# Patient Record
Sex: Female | Born: 1970
Health system: Southern US, Community
[De-identification: ages and names within clinical notes are randomized; demographics above are authoritative.]

## PROBLEM LIST (undated history)

## (undated) DIAGNOSIS — I472 Ventricular tachycardia, unspecified: Secondary | ICD-10-CM

## (undated) DIAGNOSIS — J309 Allergic rhinitis, unspecified: Secondary | ICD-10-CM

## (undated) DIAGNOSIS — R002 Palpitations: Secondary | ICD-10-CM

## (undated) DIAGNOSIS — J45909 Unspecified asthma, uncomplicated: Secondary | ICD-10-CM

## (undated) DIAGNOSIS — R079 Chest pain, unspecified: Secondary | ICD-10-CM

## (undated) DIAGNOSIS — F419 Anxiety disorder, unspecified: Secondary | ICD-10-CM

## (undated) DIAGNOSIS — F988 Other specified behavioral and emotional disorders with onset usually occurring in childhood and adolescence: Secondary | ICD-10-CM

## (undated) DIAGNOSIS — G43909 Migraine, unspecified, not intractable, without status migrainosus: Secondary | ICD-10-CM

## (undated) DIAGNOSIS — I10 Essential (primary) hypertension: Secondary | ICD-10-CM

## (undated) DIAGNOSIS — K279 Peptic ulcer, site unspecified, unspecified as acute or chronic, without hemorrhage or perforation: Secondary | ICD-10-CM

## (undated) HISTORY — DX: Peptic ulcer, site unspecified, unspecified as acute or chronic, without hemorrhage or perforation: K27.9

## (undated) HISTORY — DX: Ventricular tachycardia, unspecified: I47.20

## (undated) HISTORY — DX: Ventricular tachycardia: I47.2

## (undated) HISTORY — DX: Unspecified asthma, uncomplicated: J45.909

## (undated) HISTORY — PX: TUBAL LIGATION: SHX77

## (undated) HISTORY — DX: Other specified behavioral and emotional disorders with onset usually occurring in childhood and adolescence: F98.8

## (undated) HISTORY — DX: Migraine, unspecified, not intractable, without status migrainosus: G43.909

## (undated) HISTORY — DX: Palpitations: R00.2

## (undated) HISTORY — DX: Chest pain, unspecified: R07.9

## (undated) HISTORY — DX: Allergic rhinitis, unspecified: J30.9

## (undated) HISTORY — DX: Anxiety disorder, unspecified: F41.9

---

## 1997-11-12 ENCOUNTER — Ambulatory Visit (HOSPITAL_COMMUNITY): Admission: RE | Admit: 1997-11-12 | Discharge: 1997-11-12 | Payer: Self-pay | Admitting: Obstetrics and Gynecology

## 1998-03-30 ENCOUNTER — Inpatient Hospital Stay (HOSPITAL_COMMUNITY): Admission: AD | Admit: 1998-03-30 | Discharge: 1998-04-02 | Payer: Self-pay | Admitting: Obstetrics and Gynecology

## 1999-05-08 ENCOUNTER — Other Ambulatory Visit: Admission: RE | Admit: 1999-05-08 | Discharge: 1999-05-08 | Payer: Self-pay | Admitting: Obstetrics and Gynecology

## 1999-05-29 ENCOUNTER — Observation Stay (HOSPITAL_COMMUNITY): Admission: RE | Admit: 1999-05-29 | Discharge: 1999-05-30 | Payer: Self-pay | Admitting: Oral & Maxillofacial Surgery

## 2000-02-13 ENCOUNTER — Ambulatory Visit (HOSPITAL_COMMUNITY): Admission: RE | Admit: 2000-02-13 | Discharge: 2000-02-13 | Payer: Self-pay | Admitting: *Deleted

## 2000-02-13 ENCOUNTER — Encounter (INDEPENDENT_AMBULATORY_CARE_PROVIDER_SITE_OTHER): Payer: Self-pay | Admitting: Specialist

## 2000-05-07 ENCOUNTER — Other Ambulatory Visit: Admission: RE | Admit: 2000-05-07 | Discharge: 2000-05-07 | Payer: Self-pay | Admitting: Gynecology

## 2000-06-14 ENCOUNTER — Other Ambulatory Visit: Admission: RE | Admit: 2000-06-14 | Discharge: 2000-06-14 | Payer: Self-pay | Admitting: Gynecology

## 2000-06-14 ENCOUNTER — Encounter (INDEPENDENT_AMBULATORY_CARE_PROVIDER_SITE_OTHER): Payer: Self-pay

## 2000-10-25 ENCOUNTER — Other Ambulatory Visit: Admission: RE | Admit: 2000-10-25 | Discharge: 2000-10-25 | Payer: Self-pay | Admitting: Obstetrics and Gynecology

## 2000-10-28 ENCOUNTER — Ambulatory Visit (HOSPITAL_COMMUNITY): Admission: RE | Admit: 2000-10-28 | Discharge: 2000-10-28 | Payer: Self-pay | Admitting: Obstetrics and Gynecology

## 2000-10-28 ENCOUNTER — Encounter (INDEPENDENT_AMBULATORY_CARE_PROVIDER_SITE_OTHER): Payer: Self-pay | Admitting: Specialist

## 2000-12-27 ENCOUNTER — Encounter: Payer: Self-pay | Admitting: Obstetrics and Gynecology

## 2000-12-27 ENCOUNTER — Ambulatory Visit (HOSPITAL_COMMUNITY): Admission: RE | Admit: 2000-12-27 | Discharge: 2000-12-27 | Payer: Self-pay | Admitting: Obstetrics and Gynecology

## 2001-02-06 ENCOUNTER — Other Ambulatory Visit: Admission: RE | Admit: 2001-02-06 | Discharge: 2001-02-06 | Payer: Self-pay | Admitting: Obstetrics and Gynecology

## 2001-09-29 ENCOUNTER — Other Ambulatory Visit: Admission: RE | Admit: 2001-09-29 | Discharge: 2001-09-29 | Payer: Self-pay | Admitting: Obstetrics and Gynecology

## 2002-02-22 ENCOUNTER — Inpatient Hospital Stay (HOSPITAL_COMMUNITY): Admission: AD | Admit: 2002-02-22 | Discharge: 2002-02-22 | Payer: Self-pay | Admitting: Obstetrics and Gynecology

## 2002-02-25 ENCOUNTER — Inpatient Hospital Stay (HOSPITAL_COMMUNITY): Admission: AD | Admit: 2002-02-25 | Discharge: 2002-02-25 | Payer: Self-pay | Admitting: Obstetrics and Gynecology

## 2002-02-27 ENCOUNTER — Inpatient Hospital Stay (HOSPITAL_COMMUNITY): Admission: AD | Admit: 2002-02-27 | Discharge: 2002-03-01 | Payer: Self-pay | Admitting: Obstetrics and Gynecology

## 2002-03-16 ENCOUNTER — Inpatient Hospital Stay (HOSPITAL_COMMUNITY): Admission: AD | Admit: 2002-03-16 | Discharge: 2002-03-16 | Payer: Self-pay | Admitting: Obstetrics and Gynecology

## 2002-03-27 ENCOUNTER — Inpatient Hospital Stay (HOSPITAL_COMMUNITY): Admission: AD | Admit: 2002-03-27 | Discharge: 2002-03-27 | Payer: Self-pay | Admitting: Obstetrics and Gynecology

## 2002-03-31 ENCOUNTER — Inpatient Hospital Stay (HOSPITAL_COMMUNITY): Admission: AD | Admit: 2002-03-31 | Discharge: 2002-03-31 | Payer: Self-pay | Admitting: Obstetrics and Gynecology

## 2002-04-06 ENCOUNTER — Encounter (INDEPENDENT_AMBULATORY_CARE_PROVIDER_SITE_OTHER): Payer: Self-pay

## 2002-04-06 ENCOUNTER — Inpatient Hospital Stay (HOSPITAL_COMMUNITY): Admission: AD | Admit: 2002-04-06 | Discharge: 2002-04-09 | Payer: Self-pay | Admitting: Obstetrics and Gynecology

## 2002-05-19 ENCOUNTER — Other Ambulatory Visit: Admission: RE | Admit: 2002-05-19 | Discharge: 2002-05-19 | Payer: Self-pay | Admitting: Obstetrics and Gynecology

## 2003-06-11 ENCOUNTER — Ambulatory Visit (HOSPITAL_COMMUNITY): Admission: RE | Admit: 2003-06-11 | Discharge: 2003-06-11 | Payer: Self-pay | Admitting: Obstetrics and Gynecology

## 2003-07-30 ENCOUNTER — Other Ambulatory Visit: Admission: RE | Admit: 2003-07-30 | Discharge: 2003-07-30 | Payer: Self-pay | Admitting: Obstetrics and Gynecology

## 2004-08-14 ENCOUNTER — Other Ambulatory Visit: Admission: RE | Admit: 2004-08-14 | Discharge: 2004-08-14 | Payer: Self-pay | Admitting: Obstetrics and Gynecology

## 2004-10-12 ENCOUNTER — Encounter (INDEPENDENT_AMBULATORY_CARE_PROVIDER_SITE_OTHER): Payer: Self-pay | Admitting: Specialist

## 2004-10-12 ENCOUNTER — Ambulatory Visit (HOSPITAL_COMMUNITY): Admission: RE | Admit: 2004-10-12 | Discharge: 2004-10-12 | Payer: Self-pay | Admitting: Obstetrics and Gynecology

## 2005-08-02 ENCOUNTER — Encounter: Admission: RE | Admit: 2005-08-02 | Discharge: 2005-08-02 | Payer: Self-pay | Admitting: Obstetrics and Gynecology

## 2005-09-20 ENCOUNTER — Other Ambulatory Visit: Admission: RE | Admit: 2005-09-20 | Discharge: 2005-09-20 | Payer: Self-pay | Admitting: Obstetrics and Gynecology

## 2006-10-16 ENCOUNTER — Ambulatory Visit: Payer: Self-pay | Admitting: Cardiology

## 2006-10-16 ENCOUNTER — Ambulatory Visit: Admission: RE | Admit: 2006-10-16 | Discharge: 2006-10-16 | Payer: Self-pay | Admitting: Internal Medicine

## 2006-10-16 ENCOUNTER — Encounter: Payer: Self-pay | Admitting: Cardiology

## 2007-05-09 ENCOUNTER — Encounter: Admission: RE | Admit: 2007-05-09 | Discharge: 2007-05-09 | Payer: Self-pay | Admitting: Allergy and Immunology

## 2007-10-10 ENCOUNTER — Ambulatory Visit: Payer: Self-pay | Admitting: Pulmonary Disease

## 2007-10-10 DIAGNOSIS — J309 Allergic rhinitis, unspecified: Secondary | ICD-10-CM | POA: Insufficient documentation

## 2007-10-10 DIAGNOSIS — J45909 Unspecified asthma, uncomplicated: Secondary | ICD-10-CM | POA: Insufficient documentation

## 2007-10-10 HISTORY — DX: Unspecified asthma, uncomplicated: J45.909

## 2007-10-10 HISTORY — DX: Allergic rhinitis, unspecified: J30.9

## 2007-11-10 ENCOUNTER — Encounter: Payer: Self-pay | Admitting: Pulmonary Disease

## 2010-11-24 NOTE — Discharge Summary (Signed)
NAMELANYLA, [name]                           ACCOUNT NO.:  [id number]   MEDICAL RECORD NO.:  [id number]                   PATIENT TYPE:  INP   LOCATION:  9137                                 FACILITY:  [facility]   PHYSICIAN:  Guy Sandifer. Henderson Cloud, M.D.              DATE OF BIRTH:  September 26, 1970   DATE OF ADMISSION:  04/06/2002  DATE OF DISCHARGE:  04/09/2002                                 DISCHARGE SUMMARY   ADMISSION DIAGNOSES:  1. Intrauterine pregnancy at 38.5 weeks estimated gestational age.  2. Induction of labor due to favorable cervix.  3. Large estimated fetal weight.   DISCHARGE DIAGNOSES:  1. Status post low-transverse cesarean section.  2. Viable female infant.   PROCEDURE:  Primary low-transverse cesarean section.   REASON FOR ADMISSION:  Please see dictated History and Physical.   HOSPITAL COURSE:  Patient was admitted to [facility] at  38.5 weeks estimated gestational age.  Patient presented with a favorable  cervix for induction of labor.  Estimated fetal weight was estimated to be  large and patient had previously had a prolonged labor with a large baby who  weighed 8 pounds 7 ounces.  Artifical rupture of membranes was performed  revealing clear fluid.  Fetal heart tones were reassuring.  Patient was  started on Pitocin for augmentation of labor.  Patient progressed to  complete dilation.  Contractions were fairly frequent and Pitocin was  discontinued as a attempt not to overstimulate.  Fetal heart tones had been  reassuring.  Subsequently fetal bradycardia was noted in the 60's and 80's.  This responded slowly to position changes, oxygen administration and  hydration.  Vag exam was not able to find evidence of a cord prolapse.  Fetal heart tones responded to an increase to 160-170.  Tracings became flat  with poor variability and repetitive variable decelerations were noted with  each contraction.  Decision was made to proceed with an urgent cesarean  delivery due to fetal distress.  Patient was taken to the operating room  where spinal anesthesia was dosed adequately to a surgical level.  A low-  transverse incision was made with delivery of a viable female infant weighing  10 pounds 0 ounces with Apgars of 3 at one minute and 6 at five minutes and  7 at ten minutes.  Umbilical arterial cord pH was 6.91.  A tight cord was  noted around the right shoulder.  Baby responded nicely and was transferred  to the regular nursery.  The patient tolerated the procedure well.Marland Kitchen  She was  taken to the recovery room in stable condition.  On postoperative day 1, the  patient had good return of bowel function.  Abdomen was soft.  Fundus was  firm, nontender.  Patient was tolerating a regular diet without complaints  of nausea or vomiting.  A small amount of drainage was noted on the  abdominal dressing.  Hemoglobin of 11.0, platelet count 161,000 and WBC count of __________.   On postoperative day 2, the patient was ambulating without assistance.  Fundus was firm and nontender.  The incision was clean, dry and intact.   On postoperative day 3, incision was clean, dry and intact.  Staples were  removed.  Fundus was firm, nontender.  Patient was tolerating a regular diet  without complaints.  Patient was discharged home.   CONDITION ON DISCHARGE:  Good.   DIET:  Regular as tolerated.   ACTIVITY:  No heavy lifting.  No driving x 2 weeks.  No vaginal entry.   FOLLOW UP:  Patient is to follow up in the office in one to two weeks for an  incision check.  She is to call for temperature greater than 100 degrees,  persistent nausea or vomiting, heavy vaginal bleeding and/or redness or  drainage from the incision site.   DISCHARGE MEDICATIONS:  1. Percocet 5/325 #30 one p.o. every 4-6 hours p.r.n. pain.  2. Motrin 600 mg every 6 hours p.r.n.  3. Prenatal vitamins one p.o. day.  4. Colace one p.o. daily p.r.n.Julio Sicks, NP                           Guy Sandifer. Henderson Cloud, M.D.    CC/MEDQ  D:  05/05/2002  T:  05/06/2002  Job:  [id number]

## 2010-11-24 NOTE — Op Note (Signed)
[name], HATCHER                 ACCOUNT NO.:  [id number]   MEDICAL RECORD NO.:  [id number]          PATIENT TYPE:  AMB   LOCATION:  SDC                           FACILITY:  [facility]   PHYSICIAN:  Juluis Mire, M.D.   DATE OF BIRTH:  07-02-1971   DATE OF PROCEDURE:  10/12/2004  DATE OF DISCHARGE:                                 OPERATIVE REPORT   PREOPERATIVE DIAGNOSIS:  Menorrhagia.   POSTOPERATIVE DIAGNOSIS:  Menorrhagia.   OPERATIVE PROCEDURES:  1.  Hysteroscopy.  2.  Endometrial curettings.  3.  NovaSure ablation.   SURGEON:  Juluis Mire, M.D.   ANESTHESIA:  Sedation with paracervical block.   ESTIMATED BLOOD LOSS:  Minimal.   PACKS AND DRAINS:  None.   INTRAOPERATIVE BLOOD REPLACED:  None.   COMPLICATIONS:  None.   INDICATIONS:  As noted in the history and physical.   The procedure was as follows:  The patient was taken in the OR and placed in  the supine position.  After sedation, was placed in the dorsal lithotomy  position using the Allen stirrups.  The patient was draped as a sterile  field.  A speculum was placed in the vaginal vault.  Cervix and vagina  cleansed with Betadine.  A paracervical block was instituted using 1%  Nesacaine.  The cervix was secured with a single-tooth tenaculum.  The  uterus sounded to 8 cm.  Endocervical length was 3.5 cm.  Cavity length was  4.5 cm.  The cervix was serially dilated to a size 25 Pratt dilator.  The  hysteroscope was introduced, intrauterine cavity was distended using  lactated Ringer's.  Visualization revealed a normal endometrial cavity.  No  polyps or abnormalities.  No signs of perforation.  The NovaSure was then  inserted and properly deployed.  After manipulation, the cavity width was  3.8 cm.  At this point in time we did the CO2 test.  We passed.  There was  no sign of uterine perforation.  Ablation was undertaken at a power of 94  for 1 minute 35  seconds.  The NovaSure was removed intact along with the  single-tooth  tenaculum.  There was no active bleeding or signs of complication.  The  patient was taken out of the dorsal lithotomy position and once alert  transferred to the recovery room in good condition.  Sponge, instrument and  needle count reported as correct by the circulating nurse.      JSM/MEDQ  D:  10/12/2004  T:  10/12/2004  Job:  [id number]

## 2010-11-24 NOTE — H&P (Signed)
[facility]  Patient:    [name], [name]                          MRN: [id number] Adm. Date:  10/28/00 Attending:  Juluis Mire, M.D.                         History and Physical  REASON FOR ADMISSION:         Patient is a 40 year old gravida 4 para 1 abortus 2 married white female with an estimated date of confinement of October 30, giving her an estimated gestational age of [redacted] weeks.  She presents for D&E for nonviable first trimester pregnancy.  HISTORY OF PRESENT ILLNESS:   Patient was seen for routine obstetrical care on April 19.  Fetal heart tones were not audible.  Subsequent ultrasound revealed fetal pole with no evidence of cardiac activity consistent with a nonviable first trimester pregnancy.  The patient presents now for a D&E for management.  ALLERGIES:                    No known drug allergies.  MEDICATIONS:                  Prenatal vitamins.  PAST MEDICAL HISTORY:         Usual childhood diseases without any significant sequelae.  PREVIOUS SURGICAL HISTORY:    She had jaw surgery and bone graft implants. She had her wisdom teeth extracted.  She has had two cryotherapies of the cervix and previous partial conization and she had a previous left salpingectomy due to ectopic in August 2001.  OBSTETRICAL HISTORY:          She had a spontaneous vaginal delivery in 1989, an ectopic in August 2001, subsequent spontaneous abortion in December 2001.  FAMILY HISTORY:               Noncontributory.  SOCIAL HISTORY:               No tobacco or alcohol use.  REVIEW OF SYSTEMS:            Noncontributory.  PHYSICAL EXAMINATION:  VITAL SIGNS:                  Patient is afebrile with stable vital signs.  HEENT:                        Patient is normocephalic.  Pupils equal, round, and reactive to light and accommodation.  Extraocular muscles were intact. Sclerae and conjunctivae are clear.  Oropharynx clear.  NECK:                          Without thyromegaly.  BREASTS:                      No discrete masses.  LUNGS:                        Clear.  CARDIOVASCULAR:               Regular rate and rhythm without murmurs or gallops heard.  ABDOMEN:                      Benign.  PELVIC:  Normal external genitalia, vaginal mucosa clear. Cervix unremarkable.  Uterus approximately 8-10 weeks in size.  Adnexa unremarkable.  EXTREMITIES:                  Trace edema.  NEUROLOGIC:                   Grossly within normal limits.  BASIC IMPRESSION:             Nonviable first trimester pregnancy.  PLAN:                         The patient to undergo D&E for management of nonviable first trimester pregnancy.  Blood type is O positive; RhoGAM will not be required.  The risks have been discussed, including the risk of anesthesia; the risk of infection; the risk of hemorrhage that could necessitate transfusion with the risk of AIDS or hepatitis; the risk of injury to adjacent organs requiring diagnostic laparoscopy and possible exploratory laparotomy; the risk of deep venous thrombosis and pulmonary embolus.  The patient professed an understanding of the indications and risks. DD:  10/28/00 TD:  10/28/00 Job: [id number]

## 2010-11-24 NOTE — Discharge Summary (Signed)
   [name], [name]                           ACCOUNT NO.:  [id number]   MEDICAL RECORD NO.:  [id number]                   PATIENT TYPE:  INP   LOCATION:  9149                                 FACILITY:  [facility]   PHYSICIAN:  Duke Salvia. Marcelle Overlie, M.D.            DATE OF BIRTH:  September 17, 1970   DATE OF ADMISSION:  02/27/2002  DATE OF DISCHARGE:  03/01/2002                                 DISCHARGE SUMMARY   ADMITTING DIAGNOSES:  1. Intrauterine pregnancy at 87 and 4/7 weeks estimated gestational age.  2. Preterm labor.   DISCHARGE DIAGNOSES:  1. Intrauterine pregnancy at 27 and 6/7 weeks estimated gestational age.  2. Preterm labor on oral tocolytics.   REASON FOR ADMISSION:  Please see written H&P.   HOSPITAL COURSE:  The patient is a 40 year old gravida 5, para 3 with  estimated date of confinement October 13 which gave her an estimated  gestational age of 46 and 4/7 weeks.  The patient had been on p.o.  terbutaline at home.  The patient presented to the hospital with increasing  contractions unresponsive to subcutaneous terbutaline x2 which was given in  maternity admissions.  The patient was admitted for more aggressive therapy  for preterm labor.  Oral tocolytics were discontinued.  IV antibiotics were  administered.  Fetal monitor was placed with fetal heart tones reassuring.  Magnesium sulfate was started for tocolysis.  Betamethasone was given to  enhance fetal lung maturity.  On the following day contractions had  decreased.  Fetal heart tones were reactive.  Magnesium sulfate was  discontinued after 24 hours and oral terbutaline was restarted.  Cervix was  noted to be fingertip dilated, 50% effaced.  Bag of water was intact.  On  hospital day three patient had rare contraction.  Fundus was soft.  Contractions were minimal.  Cervix continued to be fingertip dilated.  The  patient was discharged home.   CONDITION ON DISCHARGE:  Good.   DIET:  Regular as tolerated.  Encouraged  patient to force fluids.   ACTIVITY:  Bed rest with bathroom privileges.   FOLLOW UP:  The patient is to follow up in the office in two to three days.  She is to call for increasing contractions, increased pressure, or rupture  of membranes.   DISCHARGE MEDICATIONS:  1. Terbutaline 2.5 mg p.o. q.3h.  2. Prenatal vitamins one p.o. daily.  3. Amoxicillin 500 mg one p.o. t.i.d. x7 days.     Judith Blonder, N.P.                     Richard M. Marcelle Overlie, M.D.    CGC/MEDQ  D:  03/31/2002  T:  03/31/2002  Job:  [id number]

## 2010-11-24 NOTE — H&P (Signed)
[name], [name]                 ACCOUNT NO.:  [id number]   MEDICAL RECORD NO.:  [id number]          PATIENT TYPE:  AMB   LOCATION:  SDC                           FACILITY:  [facility]   PHYSICIAN:  Juluis Mire, M.D.   DATE OF BIRTH:  1971-02-03   DATE OF ADMISSION:  DATE OF DISCHARGE:                                HISTORY & PHYSICAL   A 40 year old gravida 5, para 3, abortus 3, married white female who  presents for hysteroscopy as well as NovaSure ablation.   In relation to the present admission, it is of note the patient has had a  previous left salpingectomy done in the past for an ectopic pregnancy,  subsequently has had a previous ligation of the right fallopian tube.  She  was using an IUD at that time and it was removed.  Her cycles are about 24  days apart, 10-14 days of flow she describes as relatively heavy.  It is  certainly limiting at times from the patient's standpoint.  She underwent  saline infusion ultrasound evaluation, which was basically unremarkable.  There was no evidence of endometrial process, and we felt we were probably  dealing with adenomyosis.  In view of this, we have discussed options.  This  could include cycling with low-dose birth control pills versus the Mirena  IUD versus ablation.  The patient is admitted for endometrial ablation at  the present time.   In terms of allergies, no known drug allergies.   She is on Adderall.   PAST MEDICAL HISTORY:  The usual childhood diseases, no significant  sequelae.  She does have a history of cervical dysplasia treated with  conization and cryotherapy in the past.  She also had a history of an  ectopic pregnancy treated with a left salpingectomy.  She had a follow-up  laparoscopic bilateral tubal ligation as noted above.   From an obstetrical standpoint, she has had two vaginal deliveries and two  spontaneous abortions with D&E's.   FAMILY HISTORY:  Noncontributory.   SOCIAL HISTORY:  No tobacco or  alcohol use.   REVIEW OF SYSTEMS:  Noncontributory.   PHYSICAL EXAMINATION:  VITAL SIGNS:  The patient is afebrile with stable  vital signs.  HEENT:  The patient is normocephalic.  Pupils equal, round, and reactive to  light and accommodation, extraocular movements are intact.  Sclerae and  conjunctivae are clear.  Oropharynx clear.  NECK:  No thyromegaly.  BREASTS:  No discrete masses.  CHEST:  Lungs clear.  CARDIAC:  Regular rhythm and rate without murmurs or gallops.  ABDOMEN:  Benign.  No masses, organomegaly or tenderness.  PELVIC:  Normal external genitalia.  Vaginal mucosa clear.  Cervix  unremarkable.  Uterus upper limits of normal size, slightly boggy.  Adnexa  unremarkable.  EXTREMITIES:  Trace edema.  NEUROLOGIC:  Grossly within normal limits.   IMPRESSION:  Menorrhagia with associated adenomyosis.   PLAN:  The patient will undergo hysteroscopy, endometrial ablation.  Success  rates of 80% have been quoted.  Risks of her surgery have been explained,  including vascular hemorrhage that  could lead to hemorrhage with the need of  transfusion with the risk of AIDS or hepatitis; the risk of perforation that  could lead to injury to adjacent organs requiring further exploratory  surgery; the risk of deep venous thrombosis and pulmonary embolus as well as  anesthetic concerns.  The patient expressed an understanding of the  indications and risks.      JSM/MEDQ  D:  10/12/2004  T:  10/12/2004  Job:  [id number]

## 2010-11-24 NOTE — H&P (Signed)
NAME:  [name], [name]                           ACCOUNT NO.:  [id number]   MEDICAL RECORD NO.:  [id number]                   PATIENT TYPE:  AMB   LOCATION:  SDC                                  [facility]ACILITY:  [facility]   PHYSICIAN:  Juluis Mire, M.D.                DATE OF BIRTH:  1971/05/24   DATE OF ADMISSION:  06/11/2003  DATE OF DISCHARGE:                                HISTORY & PHYSICAL   HISTORY OF PRESENT ILLNESS:  The patient is a 40 year old gravida 5 para 2  abortus 3 married female who presents for laparoscopic bilateral tubal  ligation.   PRESENT ADMISSION:  The patient is desirous of permanent sterilization.  Potential irreversibility of sterilization is explained.  A failure rate of  1/200 is quoted.  This can be in the form of ectopic pregnancy.  The risks  of surgery were also explained.  The patient is adamant about proceeding  with laparoscopic tubal.   ALLERGIES:  No known drug allergies.   MEDICATIONS:  None.   PAST MEDICAL HISTORY:  Usual childhood diseases, no significant sequelae.   PAST SURGICAL HISTORY:  She did have a history of a previous ectopic with  removal of one fallopian tube - she is unsure of which one.   OBSTETRICAL HISTORY:  She has had two vaginal deliveries and two spontaneous  abortions with D&Es.   FAMILY HISTORY:  Noncontributory.   SOCIAL HISTORY:  No tobacco or alcohol use.   REVIEW OF SYSTEMS:  Noncontributory.   PHYSICAL EXAMINATION:  VITAL SIGNS:  The patient is afebrile with stable  vital signs.  HEENT:  The patient is normocephalic.  Pupils equal, round, and reactive to  light and accommodation.  Extraocular movements were intact.  Sclerae and  conjunctivae were clear, oropharynx clear.  NECK:  Without thyromegaly.  BREASTS:  No discrete masses.  LUNGS:  Clear.  CARDIOVASCULAR:  Regular rhythm and rate, no murmurs or gallops.  ABDOMEN:  Benign.  No mass, organomegaly, or tenderness.  PELVIC:  Normal external genitalia, vaginal  mucosa clear.  Cervix  unremarkable.  Uterus normal size, shape, and contour.  Adnexa free of  masses or tenderness.  EXTREMITIES:  Trace edema.  NEUROLOGIC:  Grossly within normal limits.   IMPRESSION:  Multiparity, desires sterility.   PLAN:  The patient to undergo laparoscopic bilateral tubal fulguration.  The  risks of surgery have been discussed including the risk of infection; the  risk of hemorrhage that could necessitate transfusion with the risk of AIDS  or hepatitis; the risk of injury to adjacent organs including bladder,  bowel, or ureters that could require further exploratory surgery; the risk  of deep venous thrombosis and pulmonary embolus.  The patient professed an  understanding of the indications and risks.  Juluis Mire, M.D.    JSM/MEDQ  D:  06/11/2003  T:  06/11/2003  Job:  [id number]

## 2010-11-24 NOTE — Op Note (Signed)
[facility]  Patient:    [name], [name]                      MRN: [id number] Proc. Date: 02/13/00 Adm. Date:  [id number] Attending:  Pleas Koch                           Operative Report  PREOPERATIVE DIAGNOSIS:       1. Vaginal bleeding.                               2. Intrauterine fluid structure.                               3. Left adnexal mass, suspect ectopic pregnancy.  POSTOPERATIVE DIAGNOSIS:      1. Left ectopic pregnancy.                               2. Pelvic adhesions.                               3. Intrauterine blood.  OPERATION:                    1. Laparoscopic left salpingectomy.                               2. Lysis of adhesions.                               3. Dilatation and evacuation.  SURGEON:                      Georgina Peer, M.D.  ANESTHESIA:                   General.  ESTIMATED BLOOD LOSS:         100 cc nonclotted blood in the pelvis at the beginning of the case, and 100 cc blood loss.  COMPLICATIONS:                None.  FINDINGS:                     Slowly bleeding left ectopic pregnancy adherent to the left ovary, rectosigmoid, and ovarian fossa.  Normal right tube and ovary.  Intrauterine blood with minimal tissue.  INDICATIONS:                  This is a 40 year old gravida 2, para 1 who had conceived on Clomid.  She had some vaginal bleeding, and hCGs were rising inappropriately, and an ultrasound showed a 10-week size intrauterine sac-like structure with debris and a left adnexal fetal pole with a heart beat of approximately eight weeks size.  Suspicion for ectopic pregnancy was high and the patient was scheduled for surgery.  It was felt that the ectopic gestation was too large for methotrexate attempt.  DESCRIPTION OF PROCEDURE:     After an informed consent, the patient was taken to the operating room and given a general anesthetic and placed in the  dorsal lithotomy position.  The  abdomen, perineum, and vagina were sterilely draped. A vertical subumbilical incision was made.  A Veress needle was placed in the abdominal cavity with low pressure insufflation confirmed by water drop test. Four L of carbon dioxide gas insufflated.  The laparoscopic trocar and sleeve were introduced.  A right lower quadrant port was introduced and a 12 mm suprapubic port was introduced.  The following pelvic findings were noted: There appeared to be no injury from the laparoscopic trocar placements.  There appeared to be approximately 100 cc of nonclotted blood in the pelvis.  There was a tubal mass consistent with an ectopic pregnancy which was noted in the left adnexa.  This was adherent to the ovary, the left pelvic side wall, and the rectosigmoid, which had walled it off.  The right tube and ovary appeared to be normal.  The upper abdomen appeared to be normal.  With hydrodissection, the blood was removed.  The ectopic dissected free from the rectosigmoid and the ovary.  The dilation of the tube which was convoluted and phimotic extended to the fimbria and to within 1.0 cm of the cornu.  It was not felt the tube could be salvaged.  Therefore a salpingectomy was planned by introducing a tripolar cutting coagulating instrument and cutting the tube and the mesosalpinx progressively, freeing it from its attachments.  It was placed in an Endo bag and removed through widening the suprapubic incision.  The fascia was then closed with Vicryl suture.  The pelvis was copiously irrigated.  There was no active bleeding from the left ovarian site or fossa. There is no damage to the rectosigmoid.  Blood and debris were suctioned free. The procedure was terminated.  Marcaine was injected into the incisional sites prior to incision.  The ports were removed.  The sponge, needle, and instrument counts were correct.  The incisions were closed with Dexon sutures. The D&E was then performed by dilating the  cervix to a 24-French with [facility] dilators.  A curved suction curet was used to evacuate the uterine contents which appeared to be mostly old blood and very minimal tissue.  There was minimal bleeding at the end of the case.  The tissue was sent as products of conception.  The patient was then awakened and transferred to the recovery area in stable condition. DD:  02/13/00 TD:  02/14/00 Job: [id number]

## 2010-11-24 NOTE — Op Note (Signed)
[name], [name]                           ACCOUNT NO.:  [id number]   MEDICAL RECORD NO.:  [id number]                   PATIENT TYPE:  INP   LOCATION:  9137                                 FACILITY:  [facility]   PHYSICIAN:  Juluis Mire, M.D.                DATE OF BIRTH:  1970-11-14   DATE OF PROCEDURE:  04/06/2002  DATE OF DISCHARGE:                                 OPERATIVE REPORT   PREOPERATIVE DIAGNOSES:  1. Intrauterine pregnancy at term with fetal distress in the form of fetal     bradycardia and subsequent fetal tachycardia with severe variable     decelerations.  2. Large estimated fetal weight.   POSTOPERATIVE DIAGNOSES:  1. Intrauterine pregnancy at term with fetal distress in the form of fetal     bradycardia and subsequent fetal tachycardia with severe variable     decelerations.  2. Large estimated fetal weight.   OPERATIVE PROCEDURE:  Low transverse cesarean section.   SURGEON:  Juluis Mire, M.D.   ANESTHESIA:  Combination of local to the skin as well as epidural.   ESTIMATED BLOOD LOSS:  800 cc.   PACKS AND DRAINS:  None.   INTRAOPERATIVE BLOOD PLACED:  None.   COMPLICATIONS:  None.   INDICATIONS:  The patient is a 40 year old gravida 5, para 1, abortus 3  married white female estimated gestational age of 38-1/2 weeks.  She  presents with a favorable cervix for induction of labor.  This is due to a  large estimated fetal weight of approximately 7 pounds three weeks ago and a  history of prolonged labor with the last baby who weighed 8 pounds 7 ounces.  Artificial rupture of membranes performed on the morning of admission.  She  was begun on Pitocin augmentation.  She progressed to complete.  At the time  of the check it was noted that the contractions were fairly frequent and  felt that she had over stimulation.  Pitocin was discontinued at that point  and we began to get ready for the second stage.  The fetal heart rate had  been reactive without  concern up until that point in time.  Subsequently, we  had an episode of fetal bradycardia into the 60s and 80s.  This responded  very slowly to position changes in oxygen as well as hydration.  Recheck  revealed no evidence of cord below the vertex.  At this point in time we had  a response of the fetal heart rate to the 160s and 170s.  It remained at  this high level with a very flat tracing, repetitive deep variable  decelerations with each contraction.  We felt the vertex was too high for a  safe vacuum or forceps delivery and due to large estimated fetal weight  decided to proceed with urgent cesarean section due to fetal distress.  The  risks were discussed including  the risks of infection, the risk of  hemorrhage, the risk of injury to adjacent organs including bladder, bowel,  or ureters, the risk of deep venous thrombosis and pulmonary embolus.   PROCEDURE AS FOLLOWS:  The patient was taken to the OR and placed in supine  position with left lateral tilt.  She had an epidural in place which was  dosed up.  We anesthetized the skin with 0.25% Marcaine.  A low transverse  skin incision was made with a knife, carried through the subcutaneous  tissue.  Anterior rectus fascia entered sharply and incision of the fascia  extended laterally.  Fascia was pulled off the muscles manually.  The rectus  muscles were split in the midline.  The peritoneum was entered bluntly and  the incision was expanded using manual retraction.  A low transverse bladder  flap was developed.  A low transverse uterine incision was begun with the  knife, extended laterally using manual retraction.  Amniotic fluid was clear  at this point.  The infant was delivered with elevation of head and fundal  pressure.  There was a tight cord around the right shoulder.  The infant was  a viable female who weighed 10 pounds.  Apgars were 3, 6, and 7 at one, five,  and 10 minutes.  Umbilical artery pH was 6.91.  The infant did  respond  nicely and was transferred to the regular nursery.  Placenta was delivered  manually and sent for pathologic review.  Uterus was closed in a running  locking suture of 0 chromic using a two layer closure technique.  We had  very good hemostasis at this point.  Some oozing was noted from the bladder  blade.  It was brought under control with a free tie of 0 Vicryl.  Tubes and  ovaries were visualized, noted to be unremarkable.  Pelvic cavity was  thoroughly irrigated.  Hemostasis was excellent.  Peritoneum and muscles  were closed in a running suture of 3-0 Vicryl.  Fascia closed in a running  suture of 0 PDS.  Skin was closed with staples and Steri-Strips.  Sponge,  instrument, needle count reported as correct by circulating nurse x2.  Foley  catheter remained clear at time of closure.  The patient tolerated procedure  well.  Was returned to recovery room in good condition.                                               Juluis Mire, M.D.    JSM/MEDQ  D:  04/06/2002  T:  04/07/2002  Job:  [id number]

## 2010-11-24 NOTE — Op Note (Signed)
NAME:  [name], [name]                           ACCOUNT NO.:  [id number]   MEDICAL RECORD NO.:  [id number]                   PATIENT TYPE:  AMB   LOCATION:  SDC                                  FACILITY:  [facility]   PHYSICIAN:  Juluis Mire, M.D.                DATE OF BIRTH:  [birth year]   DATE OF PROCEDURE:  06/11/2003  DATE OF DISCHARGE:                                 OPERATIVE REPORT   PREOPERATIVE DIAGNOSIS:  Multiparity, desires sterility.   POSTOPERATIVE DIAGNOSIS:  Multiparity, desires sterility.   PROCEDURE:  Open laparoscopy with right tubal fulguration.   SURGEON:  Juluis Mire, M.D.   ANESTHESIA:  General endotracheal.   ESTIMATED BLOOD LOSS:  Minimal.   PACKS AND DRAINS:  None.   INTRAOPERATIVE BLOOD REPLACED:  None.   COMPLICATIONS:  None.   INDICATIONS:  Dictated in history and physical.   The procedure is as follows:  The patient was taken to the OR and placed in  the supine position.  After a satisfactory level of general endotracheal  anesthesia obtained, the patient was placed in the dorsal lithotomy position  using the Allen stirrups.  The abdomen, perineum, and vagina were prepped  out with Betadine.  The bladder was emptied by in-and-out catheterization.  The Hulka tenaculum was put in place and secured.  The patient was draped  out for laparoscopy, a subumbilical incision made with a knife and extended  through the subcutaneous tissue.  The fascia was entered sharply and the  incision in the fascia was extended laterally.  The peritoneum was entered  sharply.  The open laparoscopic trocar was put in place and secured.  The  laparoscope was introduced.  There was no evidence of any injury to adjacent  organs.  The appendix was normal, upper abdomen including the lip and the  tip of the gallbladder was clear.  The left tube was surgically absent.  The  left ovary appeared to be normal.  Right tube and ovary unremarkable.  Uterus normal size and shape.   There was no other evident pelvic pathology.  The right tube was cauterized for a distance of 3 cm until the resistance  read 0.  Coagulation was then repeated, completely desiccating the tube.  Coagulation did extend out to the mesosalpinx.  At the end of the procedure  there was no evidence of injury to adjacent organs.  The abdomen was  deflated of its carbon dioxide.  The laparoscope and trocars removed.  The  subumbilical fascia closed with a figure-of-eight of 0 Vicryl.  The skin was  closed with interrupted  subcuticulars of 4-0 Vicryl.  The patient was taken out of the dorsal  lithotomy position, once alert and extubated transferred to the recovery  room in good condition.  Sponge, instrument, and needle count reported as  correct by the circulating nurse.  Juluis Mire, M.D.    JSM/MEDQ  D:  06/11/2003  T:  06/12/2003  Job:  [id number]

## 2011-01-09 ENCOUNTER — Other Ambulatory Visit: Payer: Self-pay | Admitting: Obstetrics and Gynecology

## 2011-01-09 DIAGNOSIS — IMO0002 Reserved for concepts with insufficient information to code with codable children: Secondary | ICD-10-CM

## 2011-01-15 ENCOUNTER — Other Ambulatory Visit: Payer: Self-pay

## 2011-01-18 ENCOUNTER — Ambulatory Visit
Admission: RE | Admit: 2011-01-18 | Discharge: 2011-01-18 | Disposition: A | Discharge status: Home / Self Care | Payer: BC Managed Care – PPO | Source: Ambulatory Visit | Attending: Obstetrics and Gynecology | Admitting: Obstetrics and Gynecology

## 2011-01-18 DIAGNOSIS — IMO0002 Reserved for concepts with insufficient information to code with codable children: Secondary | ICD-10-CM

## 2013-09-29 ENCOUNTER — Other Ambulatory Visit: Payer: Self-pay | Admitting: Neurological Surgery

## 2013-09-29 DIAGNOSIS — M5010 Cervical disc disorder with radiculopathy, unspecified cervical region: Secondary | ICD-10-CM

## 2013-09-30 ENCOUNTER — Ambulatory Visit
Admission: RE | Admit: 2013-09-30 | Discharge: 2013-09-30 | Disposition: A | Discharge status: Home / Self Care | Payer: 59 | Source: Ambulatory Visit | Attending: Neurological Surgery | Admitting: Neurological Surgery

## 2013-09-30 VITALS — BP 134/80 | HR 88

## 2013-09-30 DIAGNOSIS — M5010 Cervical disc disorder with radiculopathy, unspecified cervical region: Secondary | ICD-10-CM

## 2013-09-30 DIAGNOSIS — M5126 Other intervertebral disc displacement, lumbar region: Secondary | ICD-10-CM

## 2013-09-30 DIAGNOSIS — M5020 Other cervical disc displacement, unspecified cervical region: Secondary | ICD-10-CM

## 2013-09-30 MED ORDER — TRIAMCINOLONE ACETONIDE 40 MG/ML IJ SUSP (RADIOLOGY)
60.0000 mg | Freq: Once | INTRAMUSCULAR | Status: AC
  Administered 2013-09-30: 60 mg via EPIDURAL

## 2013-09-30 MED ORDER — DIAZEPAM 5 MG PO TABS
10.0000 mg | ORAL_TABLET | Freq: Once | ORAL | Status: AC
  Administered 2013-09-30: 10 mg via ORAL

## 2013-09-30 MED ORDER — IOHEXOL 300 MG/ML  SOLN
1.0000 mL | Freq: Once | INTRAMUSCULAR | Status: AC | PRN
  Administered 2013-09-30: 1 mL via EPIDURAL

## 2013-09-30 NOTE — Discharge Instructions (Signed)

## 2014-09-09 ENCOUNTER — Other Ambulatory Visit: Payer: Self-pay | Admitting: Obstetrics and Gynecology

## 2014-09-10 LAB — CYTOLOGY - PAP

## 2014-09-13 ENCOUNTER — Other Ambulatory Visit: Payer: Self-pay | Admitting: Obstetrics and Gynecology

## 2014-09-13 DIAGNOSIS — R928 Other abnormal and inconclusive findings on diagnostic imaging of breast: Secondary | ICD-10-CM

## 2014-09-17 ENCOUNTER — Ambulatory Visit
Admission: RE | Admit: 2014-09-17 | Discharge: 2014-09-17 | Disposition: A | Discharge status: Home / Self Care | Payer: 59 | Source: Ambulatory Visit | Attending: Obstetrics and Gynecology | Admitting: Obstetrics and Gynecology

## 2014-09-17 DIAGNOSIS — R928 Other abnormal and inconclusive findings on diagnostic imaging of breast: Secondary | ICD-10-CM

## 2015-03-26 IMAGING — US US BREAST*L* LIMITED INC AXILLA
1 series · 5 of 5 positions shown · non-contrast
Comparison: Previous exam(s).

CLINICAL DATA: Recall from screening mammography with
tomosynthesis, mass in the lower inner quadrant of the left breast.

EXAM:
ULTRASOUND OF THE LEFT BREAST

[Series 1: advbreast · 5 of 5 slices shown]
[im 1/5]
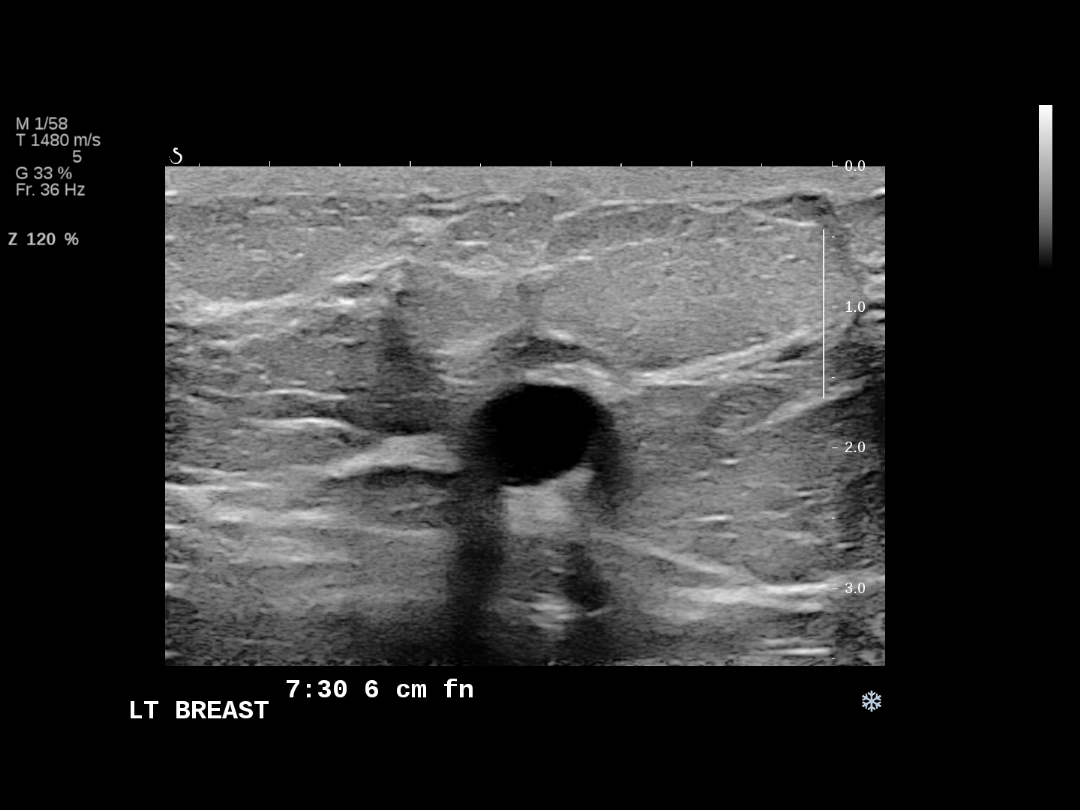
[im 2/5]
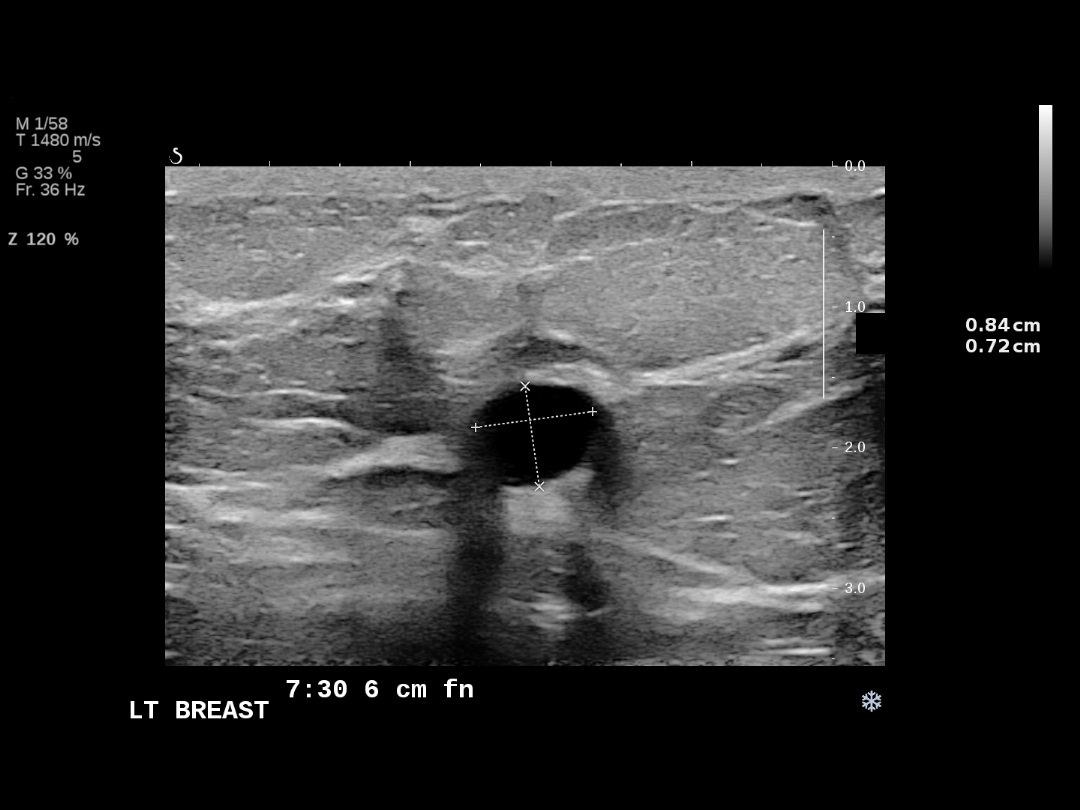
[im 3/5]
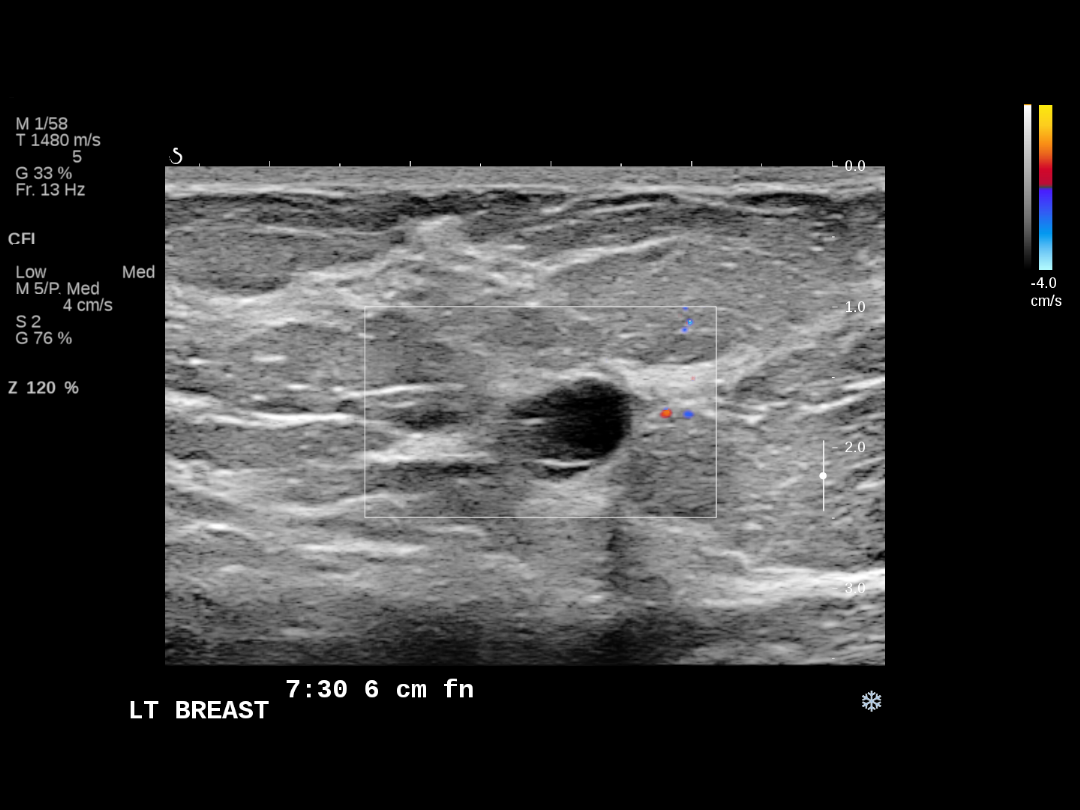
[im 4/5]
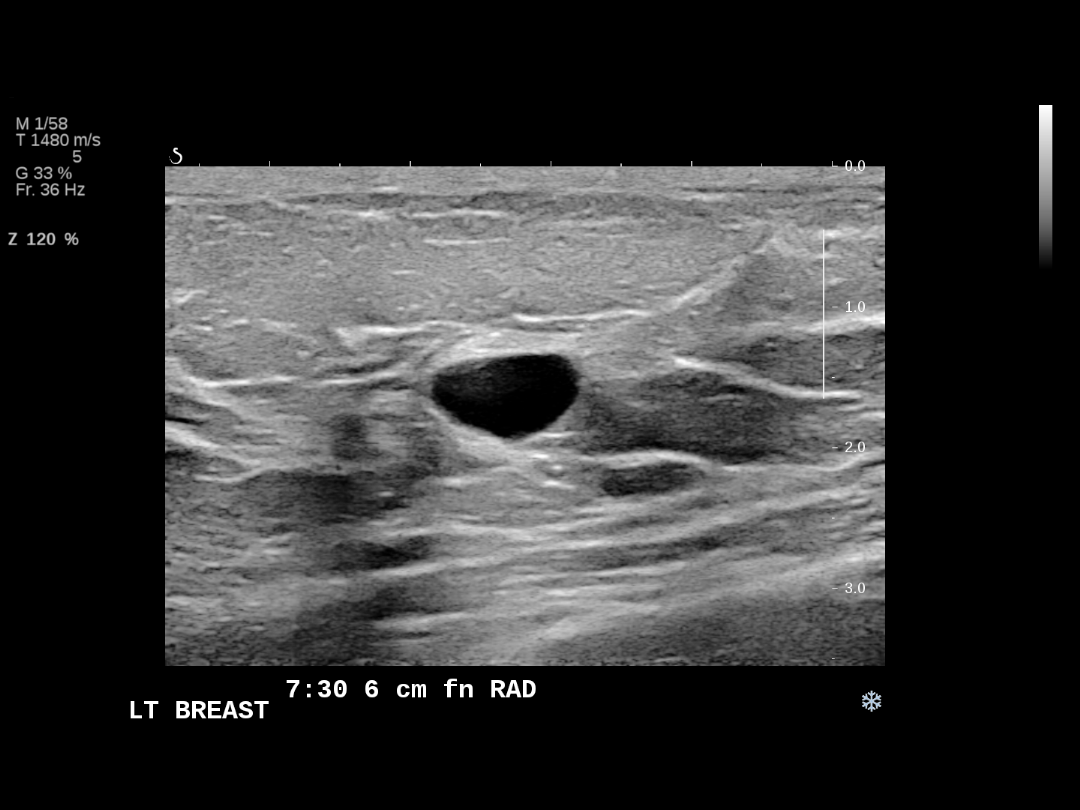
[im 5/5]
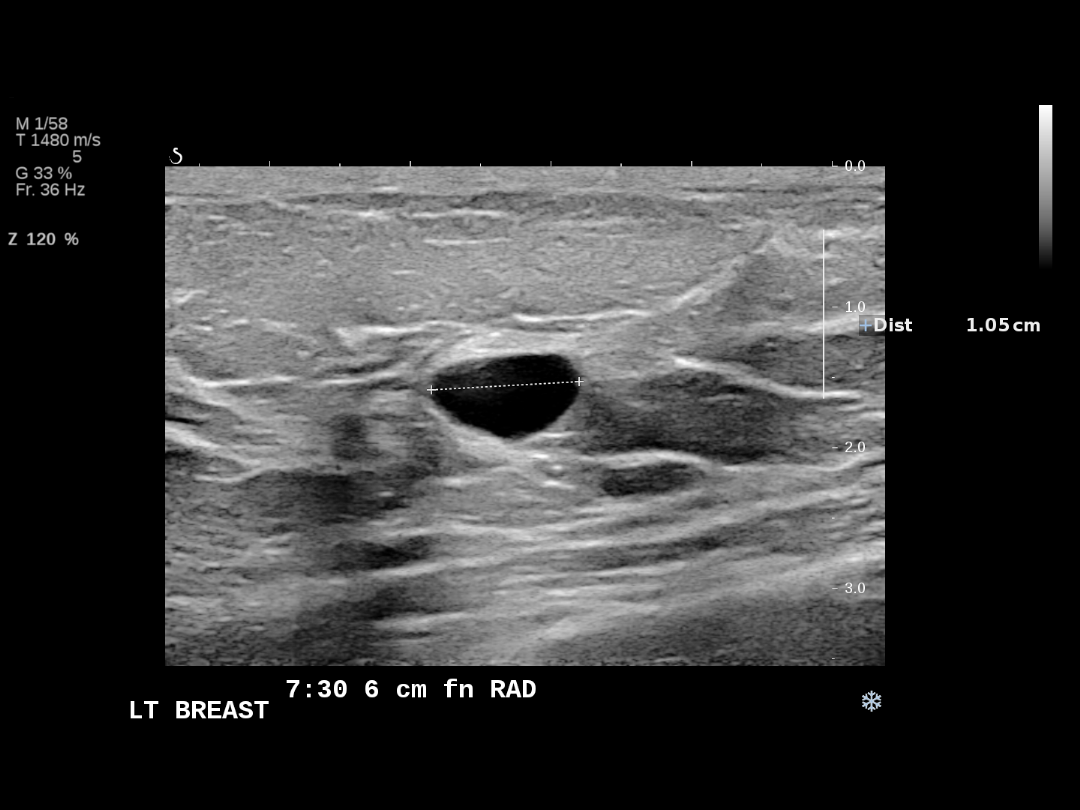

[5 of 5 positions shown; findings below may reference images not displayed]

FINDINGS: On physical exam, there is no palpable abnormality in the lower
inner left breast.

Targeted ultrasound is performed, showing a circumscribed
oval-shaped horizontally oriented anechoic mass with excellent
acoustic enhancement and no internal color Doppler flow at the 7:30
o'clock position of the left breast approximately 6 cm from the
nipple measuring approximately 11 x 7 x 8 mm, accounting for the
area of concern on screening mammography. No suspicious solid mass
or abnormal acoustic shadowing is identified.
IMPRESSION: Benign simple cyst in the lower inner left breast accounting for the
area of concern on screening mammography.

RECOMMENDATION:
Screening mammogram in one year.(Code:N4-6-YY6)

The importance of monthly self breast examination and an annual
clinical breast examination was discussed with the patient.

I have discussed the findings and recommendations with the patient.
Results were also provided in writing at the conclusion of the
visit. If applicable, a reminder letter will be sent to the patient
regarding the next appointment.

BI-RADS CATEGORY  2: Benign.

## 2016-06-25 DIAGNOSIS — R079 Chest pain, unspecified: Secondary | ICD-10-CM | POA: Insufficient documentation

## 2016-07-16 ENCOUNTER — Encounter: Payer: Self-pay | Admitting: Cardiology

## 2016-07-16 ENCOUNTER — Ambulatory Visit (INDEPENDENT_AMBULATORY_CARE_PROVIDER_SITE_OTHER): Payer: Commercial Managed Care - HMO | Admitting: Cardiology

## 2016-07-16 VITALS — BP 162/90 | HR 101 | Ht 65.5 in | Wt 175.6 lb

## 2016-07-16 DIAGNOSIS — I472 Ventricular tachycardia: Secondary | ICD-10-CM | POA: Diagnosis not present

## 2016-07-16 DIAGNOSIS — R002 Palpitations: Secondary | ICD-10-CM

## 2016-07-16 DIAGNOSIS — R011 Cardiac murmur, unspecified: Secondary | ICD-10-CM | POA: Diagnosis not present

## 2016-07-16 DIAGNOSIS — R0789 Other chest pain: Secondary | ICD-10-CM

## 2016-07-16 DIAGNOSIS — I4729 Other ventricular tachycardia: Secondary | ICD-10-CM

## 2016-07-16 MED ORDER — CETIRIZINE HCL 10 MG PO TABS: 10.0000 mg | ORAL_TABLET | Freq: Every day | ORAL | Status: DC | PRN

## 2016-07-16 MED ORDER — NEBIVOLOL HCL 5 MG PO TABS: 5.0000 mg | ORAL_TABLET | Freq: Every day | ORAL | 6 refills | Status: DC

## 2016-07-16 NOTE — Patient Instructions (Signed)
Medication Instructions:  Please start Bystolic 5 mg a day. Continue all other medications as listed.  Testing/Procedures: Your physician has requested that you have an echocardiogram. Echocardiography is a painless test that uses sound waves to create images of your heart. It provides your doctor with information about the size and shape of your heart and how well your heart's chambers and valves are working. This procedure takes approximately one hour. There are no restrictions for this procedure.  Your physician has recommended that you wear a holter monitor for 24 hours. Holter monitors are medical devices that record the heart's electrical activity. Doctors most often use these monitors to diagnose arrhythmias. Arrhythmias are problems with the speed or rhythm of the heartbeat. The monitor is a small, portable device. You can wear one while you do your normal daily activities. This is usually used to diagnose what is causing palpitations/syncope (passing out).  Follow-Up: Follow up in 1 month with Bary CastillaKaty Thompson, PA.  If you need a refill on your cardiac medications before your next appointment, please call your pharmacy.  Thank you for choosing [facility]!!

## 2016-07-16 NOTE — Progress Notes (Signed)
Cardiology Office Note    Date:  07/16/2016   ID:  [name], DOB 1971-05-02, MRN [id number]  PCP:  Meghan Labella, MD  Cardiologist:   Donato Schultz, MD     History of Present Illness:  [name] is a 46 y.o. female here for evaluation of irregular heartbeat, probable PVCs at the request of her OB/GYN physician. She had seen me in the past in 2010. She sees Dr. Sigmund Ramos. She has been taking Adderall.  She had 7 beats of nonsustained ventricular tachycardia on a Holter monitor with negative exercise treadmill test and normal echocardiogram back in 2010. She will feel an occasional fluttering sensation when laying down. No syncope. She was on metoprolol 12.5 mg twice a day. She is no longer taking because this made her feel tired.  Overall, she is noted that his blood pressures have been increased, sometimes in the 160 systolic. She has been feeling skipped beats as well. Once again no syncope. Her daughter is also seeing me later today.   She really does not complain of any chest pain. It is more like a fluttery like feeling. She has not seen Dr. Hyacinth Meeker recently for this.  For instance, [place] felt like her blood pressure was elevated. This made her more anxious.   Past Medical History:  Diagnosis Date  . ADD (attention deficit disorder)   . ALLERGIC RHINITIS 10/10/2007   Qualifier: Diagnosis of  By: Meghan Cotta MD, [name]    . Anxiety   . Chest pain   . Intrinsic asthma, unspecified 10/10/2007   Qualifier: Diagnosis of  By: Meghan Cotta MD, [name]    . Migraines   . Palpitations   . Peptic ulcer   . VT (ventricular tachycardia) (HCC)     No past surgical history on file.  Current Medications: Outpatient Medications Prior to Visit  Medication Sig Dispense Refill  . albuterol (PROVENTIL HFA;VENTOLIN HFA) 108 (90 Base) MCG/ACT inhaler Inhale 1 puff into the lungs as needed for wheezing or shortness of breath.    .  amphetamine-dextroamphetamine (ADDERALL) 30 MG tablet Take 30 mg by mouth 2 (two) times daily.     . Multiple Vitamin (MULTIVITAMINS PO) Take by mouth.    . naproxen sodium (ANAPROX) 220 MG tablet Take 220 mg by mouth as needed.    . clonazePAM (KLONOPIN) 0.5 MG tablet Take 0.5 mg by mouth at bedtime.    . cyclobenzaprine (FLEXERIL) 10 MG tablet Take 5 mg by mouth as needed for muscle spasms.    . Fluticasone-Salmeterol (ADVAIR) 250-50 MCG/DOSE AEPB Inhale 1 puff into the lungs 2 (two) times daily as needed.    Marland Kitchen HYDROcodone-acetaminophen (LORTAB) 10-500 MG tablet Take 1 tablet by mouth every 6 (six) hours as needed for pain.    . metoprolol tartrate (LOPRESSOR) 25 MG tablet Take 12.5 mg by mouth 2 (two) times daily.    . Probiotic Product (SOLUBLE FIBER/PROBIOTICS PO) Take by mouth as directed.     No facility-administered medications prior to visit.      Allergies:   Escitalopram oxalate; Estrogens; Pseudoephedrine; Imitrex [sumatriptan]; Mirapex [pramipexole dihydrochloride]; Strattera [atomoxetine hcl]; Sudafed [pseudoephedrine hcl]; and Bupropion hcl   Social History   Social History  . Marital status: Married    Spouse name: N/A  . Number of children: N/A  . Years of education: N/A   Social History Main Topics  . Smoking status: Former Smoker    Types:  Cigarettes    Quit date: 06/25/1990  . Smokeless tobacco: Never Used  . Alcohol use 1.8 - 2.4 oz/week    3 - 4 Glasses of wine per week  . Drug use: No  . Sexual activity: Yes   Other Topics Concern  . None   Social History Narrative  . None     Family History:  The patient's family history includes ADD / ADHD in her daughter and son; Allergic rhinitis in her daughter; Anemia in her sister; Asthma in her mother; Diabetes in her mother; Emphysema in her mother; Heart attack in her maternal uncle and mother; Heart disease in her mother; Hypertension in her mother; Other in her daughter.  Her mother died age 48-cardiac  arrest. CAD in uncle. ROS:   Please see the history of present illness.    ROS All other systems reviewed and are negative.   PHYSICAL EXAM:   VS:  BP (!) 162/90   Pulse (!) 101   Ht 5' 5.5" (1.664 m)   Wt 175 lb 9.6 oz (79.7 kg)   LMP  (LMP Unknown)   BMI 28.78 kg/m    GEN: Well nourished, well developed, in no acute distress  HEENT: normal  Neck: no JVD, carotid bruits, or masses, anterior neck scar noted (disc) Cardiac: RRR; 1/6 systolic murmur, no rubs, or gallops,no edema  Respiratory:  clear to auscultation bilaterally, normal work of breathing GI: soft, nontender, nondistended, + BS MS: no deformity or atrophy  Skin: warm and dry, no rash Neuro:  Alert and Oriented x 3, Strength and sensation are intact Psych: euthymic mood, full affect  Wt Readings from Last 3 Encounters:  07/16/16 175 lb 9.6 oz (79.7 kg)  10/10/07 134 lb 4 oz (60.9 kg)      Studies/Labs Reviewed:   EKG:  EKG is ordered today.  The ekg ordered today demonstrates1/8/18-sinus tachycardia rate 101 bpm with no other abnormality, QT interval 334 ms.  Recent Labs: No results found for requested labs within last 8760 hours.   Lipid Panel No results found for: CHOL, TRIG, HDL, CHOLHDL, VLDL, LDLCALC, LDLDIRECT  Additional studies/ records that were reviewed today include:  Prior office notes, echocardiogram, monitor reviewed    ASSESSMENT:    1. Palpitations   2. Other chest pain   3. Murmur, cardiac   4. NSVT (nonsustained ventricular tachycardia) (HCC)      PLAN:  In order of problems listed above:  Palpitations  - In the past, has been diagnosed with PVCs and actually had a brief run of NSVT, 7 beats on a Holter monitor. Her complete workup at the time with echocardiogram and exercise treadmill test was also reassuring. She was placed on low-dose beta blocker at the time that subsequent stopped because of fatigue.  - I will start her on Bystolic 5 mg today which can help both with her  blood pressure as well as palpitations.  - Recheck echocardiogram, soft systolic murmur appreciated.  - Check a 24-hour Holter monitor as well to evaluate her palpitations. Remember in the past she had 7 beats of NSVT.  - Adderall certainly can increase overall heart rate in lead to more palpitations however she benefits greatly from this. Do not need to stop.  Nonsustained ventricular tachycardia  - We are going to repeat echocardiogram. Remember this was in the past. No syncope. No other high-risk symptoms. Bystolic  Essential hypertension  - Blood pressure at one point was quite low she states. Now elevated on  a consistent basis.  - Starting Bystolic   Medication Adjustments/Labs and Tests Ordered: Current medicines are reviewed at length with the patient today.  Concerns regarding medicines are outlined above.  Medication changes, Labs and Tests ordered today are listed in the Patient Instructions below. Patient Instructions  Medication Instructions:  Please start Bystolic 5 mg a day. Continue all other medications as listed.  Testing/Procedures: Your physician has requested that you have an echocardiogram. Echocardiography is a painless test that uses sound waves to create images of your heart. It provides your doctor with information about the size and shape of your heart and how well your heart's chambers and valves are working. This procedure takes approximately one hour. There are no restrictions for this procedure.  Your physician has recommended that you wear a holter monitor for 24 hours. Holter monitors are medical devices that record the heart's electrical activity. Doctors most often use these monitors to diagnose arrhythmias. Arrhythmias are problems with the speed or rhythm of the heartbeat. The monitor is a small, portable device. You can wear one while you do your normal daily activities. This is usually used to diagnose what is causing palpitations/syncope (passing  out).  Follow-Up: Follow up in 1 month with Bary CastillaKaty Thompson, PA.  If you need a refill on your cardiac medications before your next appointment, please call your pharmacy.  [facility] you [facility] choosing [facility]!!        Signed, Donato SchultzMark Jordyn Hofacker, MD  07/16/2016 11:13 AM    [facility] [address], [place], [place]  [address] Phone: [phone]; Fax: [phone]

## 2016-07-20 ENCOUNTER — Ambulatory Visit (INDEPENDENT_AMBULATORY_CARE_PROVIDER_SITE_OTHER): Payer: Commercial Managed Care - HMO

## 2016-07-20 DIAGNOSIS — R002 Palpitations: Secondary | ICD-10-CM

## 2016-07-20 DIAGNOSIS — R011 Cardiac murmur, unspecified: Secondary | ICD-10-CM | POA: Diagnosis not present

## 2016-07-24 ENCOUNTER — Encounter: Payer: Self-pay | Admitting: Cardiology

## 2016-07-30 ENCOUNTER — Other Ambulatory Visit: Payer: Self-pay

## 2016-07-30 ENCOUNTER — Ambulatory Visit (HOSPITAL_COMMUNITY): Payer: Commercial Managed Care - HMO | Attending: Cardiovascular Disease

## 2016-07-30 DIAGNOSIS — R011 Cardiac murmur, unspecified: Secondary | ICD-10-CM | POA: Diagnosis not present

## 2016-07-30 DIAGNOSIS — R002 Palpitations: Secondary | ICD-10-CM | POA: Diagnosis not present

## 2016-07-31 DIAGNOSIS — R931 Abnormal findings on diagnostic imaging of heart and coronary circulation: Secondary | ICD-10-CM | POA: Diagnosis not present

## 2016-07-31 DIAGNOSIS — R0789 Other chest pain: Secondary | ICD-10-CM | POA: Diagnosis not present

## 2016-08-16 ENCOUNTER — Encounter: Payer: Self-pay | Admitting: Physician Assistant

## 2016-08-19 NOTE — Progress Notes (Signed)
Cardiology Office Note    Date:  08/21/2016   ID:  UNIQUE SILLAS, DOB April 16, 1971, MRN [id number]  PCP:  Neldon Labella, MD  Cardiologist: Dr. Anne Fu   CC: 1 month follow up.  History of Present Illness:  [name] is a 46 y.o. female with a history of HTN, NSVT, PVCs and ADD who presents to clinic for follow up.   She had 7 beats of NSVT on a Holter monitor and a negative exercise treadmill test and normal echocardiogram back in 2010. She was on metoprolol 12.5 mg twice a day which was discontinued 2/2 fatigue.   She recently saw Dr. Anne Fu in the office on 07/16/16. She complained of palpitations and increasing BP. She was started on Bisoprolol. Subsequent 24 holter monitor (07/24/16) showed PVCs, PACs and brief atrial tach. 2D ECHO (07/30/16) showed normal LV function.  Today she presents to clinic for follow up. The bisoprolol is working is well but too expensive. BP has been running normal to mildly high at home.  No CP or SOB. No LE edema, orthopnea or PND. No dizziness or syncope. No blood in stool or urine. Occasional palpitations.     Past Medical History:  Diagnosis Date  . ADD (attention deficit disorder)   . ALLERGIC RHINITIS 10/10/2007   Qualifier: Diagnosis of  By: Craige Cotta MD, Vineet    . Anxiety   . Chest pain   . Intrinsic asthma, unspecified 10/10/2007   Qualifier: Diagnosis of  By: Craige Cotta MD, Vineet    . Migraines   . Palpitations   . Peptic ulcer   . VT (ventricular tachycardia) (HCC)     History reviewed. No pertinent surgical history.  Current Medications: Outpatient Medications Prior to Visit  Medication Sig Dispense Refill  . albuterol (PROVENTIL HFA;VENTOLIN HFA) 108 (90 Base) MCG/ACT inhaler Inhale 1 puff into the lungs as needed for wheezing or shortness of breath.    . amphetamine-dextroamphetamine (ADDERALL) 30 MG tablet Take 30 mg by mouth 2 (two) times daily.     . Multiple Vitamin (MULTIVITAMINS PO) Take 1 tablet by mouth daily.     . naproxen  sodium (ANAPROX) 220 MG tablet Take 220 mg by mouth as needed (for pain).     Marland Kitchen rOPINIRole (REQUIP) 1 MG tablet Take 1.5 tablets by mouth at bedtime.    . nebivolol (BYSTOLIC) 5 MG tablet Take 1 tablet (5 mg total) by mouth daily. 30 tablet 6  . cetirizine (ZYRTEC) 10 MG tablet Take 1 tablet (10 mg total) by mouth daily as needed for allergies. (Patient not taking: Reported on 08/20/2016)     No facility-administered medications prior to visit.      Allergies:   Escitalopram oxalate; Estrogens; Pseudoephedrine; Imitrex [sumatriptan]; Mirapex [pramipexole dihydrochloride]; Strattera [atomoxetine hcl]; Sudafed [pseudoephedrine hcl]; and Bupropion hcl   Social History   Social History  . Marital status: Married    Spouse name: N/A  . Number of children: N/A  . Years of education: N/A   Social History Main Topics  . Smoking status: Former Smoker    Types: Cigarettes    Quit date: 06/25/1990  . Smokeless tobacco: Never Used  . Alcohol use 1.8 - 2.4 oz/week    3 - 4 Glasses of wine per week  . Drug use: No  . Sexual activity: Yes   Other Topics Concern  . None   Social History Narrative  . None     Family History:  The patient's family history includes ADD /  ADHD in her daughter and son; Allergic rhinitis in her daughter; Anemia in her sister; Asthma in her mother; Diabetes in her mother; Emphysema in her mother; Heart attack in her maternal uncle and mother; Heart disease in her mother; Hypertension in her mother; Other in her daughter.      ROS:   Please see the history of present illness.    ROS All other systems reviewed and are negative.   PHYSICAL EXAM:   VS:  BP 120/78 (BP Location: Left Arm, Patient Position: Sitting, Cuff Size: Normal)   Pulse 82   Ht 5' 5.5" (1.664 m)   Wt 178 lb 12.8 oz (81.1 kg)   BMI 29.30 kg/m    GEN: Well nourished, well developed, in no acute distress  HEENT: normal  Neck: no JVD, carotid bruits, or masses Cardiac: RRR; no murmurs, rubs,  or gallops,no edema  Respiratory:  clear to auscultation bilaterally, normal work of breathing GI: soft, nontender, nondistended, + BS MS: no deformity or atrophy  Skin: warm and dry, no rash Neuro:  Alert and Oriented x 3, Strength and sensation are intact Psych: euthymic mood, full affect  Wt Readings from Last 3 Encounters:  08/20/16 178 lb 12.8 oz (81.1 kg)  07/16/16 175 lb 9.6 oz (79.7 kg)  10/10/07 134 lb 4 oz (60.9 kg)      Studies/Labs Reviewed:   EKG:  EKG is NOT ordered today.    Recent Labs: No results found for requested labs within last 8760 hours.   Lipid Panel No results found for: CHOL, TRIG, HDL, CHOLHDL, VLDL, LDLCALC, LDLDIRECT  Additional studies/ records that were reviewed today include:  24 hour holter monitor 07/24/16  Rare PVC (3) and PAC's (27)  Brief atrial tachycardia (5 beats)  No atrial fibrillation  No ventricular tachycardia  Reassuring monitor.  2D ECHO: 07/30/2016 LV EF: 55% -   60% Study Conclusions - Left ventricle: The cavity size was normal. Systolic function was   normal. The estimated ejection fraction was in the range of 55%   to 60%. Wall motion was normal; there were no regional wall   motion abnormalities. Left ventricular diastolic function   parameters were normal.   ASSESSMENT & PLAN:   Palpitations: improved. Continue BB  ADD: okay to continue Adderall   HTN: Bp well controlled today. Running a little higher at home. ~136/87. Likes bisoprolol but expensive. Will try Coreg 6. 25mg  BID and up titate as necessary. She will call if Bp consistently running > 140/90 and I will increase to 12.5mg  BID.   Medication Adjustments/Labs and Tests Ordered: Current medicines are reviewed at length with the patient today.  Concerns regarding medicines are outlined above.  Medication changes, Labs and Tests ordered today are listed in the Patient Instructions below. Patient Instructions  Medication Instructions:  Your physician  has recommended you make the following change in your medication:  1.  STOP the Bystolic 2.  START the Coreg 6.25 taking 1 tablet twice a day   Labwork: None ordered  Testing/Procedures: None ordered  Follow-Up: Your physician wants you to follow-up in: 6 MONTHS WITH DR. Anne Fu  You will receive a reminder letter in the mail two months in advance. If you don't receive a letter, please call our office to schedule the follow-up appointment.    Any Other Special Instructions Will Be Listed Below (If Applicable). Check and log your blood pressure.  If you notice it is continuously running over 140/90, call our office for medication  adjustment.    If you need a refill on your cardiac medications before your next appointment, please call your pharmacy.      Signed, Cline CrockKathryn Junnie Loschiavo, PA-C  08/21/2016 6:06 AM    [facility] [address], [place], [place]  [id number] Phone: [phone]; Fax: [phone]) [phone]

## 2016-08-20 ENCOUNTER — Ambulatory Visit (INDEPENDENT_AMBULATORY_CARE_PROVIDER_SITE_OTHER): Payer: Commercial Managed Care - HMO | Admitting: Physician Assistant

## 2016-08-20 ENCOUNTER — Encounter: Payer: Self-pay | Admitting: Physician Assistant

## 2016-08-20 VITALS — BP 120/78 | HR 82 | Ht 65.5 in | Wt 178.8 lb

## 2016-08-20 DIAGNOSIS — I472 Ventricular tachycardia: Secondary | ICD-10-CM | POA: Diagnosis not present

## 2016-08-20 DIAGNOSIS — R002 Palpitations: Secondary | ICD-10-CM

## 2016-08-20 DIAGNOSIS — I4729 Other ventricular tachycardia: Secondary | ICD-10-CM

## 2016-08-20 MED ORDER — CARVEDILOL 6.25 MG PO TABS: 6.2500 mg | ORAL_TABLET | Freq: Two times a day (BID) | ORAL | 3 refills | Status: DC

## 2016-08-20 NOTE — Patient Instructions (Addendum)
Medication Instructions:  Your physician has recommended you make the following change in your medication:  1.  STOP the Bystolic 2.  START the Coreg 6.25 taking 1 tablet twice a day   Labwork: None ordered  Testing/Procedures: None ordered  Follow-Up: Your physician wants you to follow-up in: 6 MONTHS WITH DR. Anne FuSKAINS  You will receive a reminder letter in the mail two months in advance. If you don't receive a letter, please call our office to schedule the follow-up appointment.    Any Other Special Instructions Will Be Listed Below (If Applicable). Check and log your blood pressure.  If you notice it is continuously running over 140/90, call our office for medication adjustment.    If you need a refill on your cardiac medications before your next appointment, please call your pharmacy.

## 2016-10-09 DIAGNOSIS — Z01419 Encounter for gynecological examination (general) (routine) without abnormal findings: Secondary | ICD-10-CM | POA: Diagnosis not present

## 2016-10-25 DIAGNOSIS — Z1231 Encounter for screening mammogram for malignant neoplasm of breast: Secondary | ICD-10-CM | POA: Diagnosis not present

## 2016-11-22 DIAGNOSIS — N390 Urinary tract infection, site not specified: Secondary | ICD-10-CM | POA: Diagnosis not present

## 2016-12-04 ENCOUNTER — Telehealth: Payer: Self-pay | Admitting: *Deleted

## 2016-12-04 MED ORDER — NEBIVOLOL HCL 5 MG PO TABS: 5.0000 mg | ORAL_TABLET | Freq: Every day | ORAL | 3 refills | Status: DC

## 2016-12-04 NOTE — Telephone Encounter (Signed)
Returned pts call.  She stated that she had stopped the Coreg within the 1st month, due to her bp was still elevated over 140/80, and you had advised her to stop it, if it wasn't working, and go back to the Bystolic 5 mg qd   Pt needs a new rx. Please advise!

## 2016-12-04 NOTE — Telephone Encounter (Signed)
Okay to resume bystolic okay to refill

## 2016-12-10 DIAGNOSIS — R3915 Urgency of urination: Secondary | ICD-10-CM | POA: Diagnosis not present

## 2016-12-10 DIAGNOSIS — R350 Frequency of micturition: Secondary | ICD-10-CM | POA: Diagnosis not present

## 2016-12-10 DIAGNOSIS — M545 Low back pain: Secondary | ICD-10-CM | POA: Diagnosis not present

## 2016-12-11 DIAGNOSIS — R350 Frequency of micturition: Secondary | ICD-10-CM | POA: Diagnosis not present

## 2016-12-11 DIAGNOSIS — M545 Low back pain: Secondary | ICD-10-CM | POA: Diagnosis not present

## 2016-12-30 DIAGNOSIS — S60921A Unspecified superficial injury of right hand, initial encounter: Secondary | ICD-10-CM | POA: Diagnosis not present

## 2017-02-06 DIAGNOSIS — G2581 Restless legs syndrome: Secondary | ICD-10-CM | POA: Diagnosis not present

## 2017-02-06 DIAGNOSIS — M545 Low back pain: Secondary | ICD-10-CM | POA: Diagnosis not present

## 2017-06-21 ENCOUNTER — Other Ambulatory Visit: Payer: Self-pay

## 2017-06-21 ENCOUNTER — Emergency Department (HOSPITAL_COMMUNITY): Payer: 59

## 2017-06-21 ENCOUNTER — Emergency Department (HOSPITAL_COMMUNITY)
Admission: EM | Admit: 2017-06-21 | Discharge: 2017-06-21 | Disposition: A | Discharge status: Home / Self Care | Payer: 59 | Attending: Emergency Medicine | Admitting: Emergency Medicine

## 2017-06-21 ENCOUNTER — Encounter (HOSPITAL_COMMUNITY): Payer: Self-pay | Admitting: Emergency Medicine

## 2017-06-21 DIAGNOSIS — Z87891 Personal history of nicotine dependence: Secondary | ICD-10-CM | POA: Insufficient documentation

## 2017-06-21 DIAGNOSIS — J45909 Unspecified asthma, uncomplicated: Secondary | ICD-10-CM | POA: Diagnosis not present

## 2017-06-21 DIAGNOSIS — Z79899 Other long term (current) drug therapy: Secondary | ICD-10-CM | POA: Insufficient documentation

## 2017-06-21 DIAGNOSIS — R079 Chest pain, unspecified: Secondary | ICD-10-CM

## 2017-06-21 DIAGNOSIS — I10 Essential (primary) hypertension: Secondary | ICD-10-CM | POA: Insufficient documentation

## 2017-06-21 HISTORY — DX: Essential (primary) hypertension: I10

## 2017-06-21 LAB — I-STAT BETA HCG BLOOD, ED (MC, WL, AP ONLY)

## 2017-06-21 LAB — CBC
HEMATOCRIT: 44.3 % (ref 36.0–46.0)
HEMOGLOBIN: 15 g/dL (ref 12.0–15.0)
MCH: 31.5 pg (ref 26.0–34.0)
MCHC: 33.9 g/dL (ref 30.0–36.0)
MCV: 93.1 fL (ref 78.0–100.0)
PLATELETS: 266 10*3/uL (ref 150–400)
RBC: 4.76 MIL/uL (ref 3.87–5.11)
RDW: 12.9 % (ref 11.5–15.5)
WBC: 8.9 10*3/uL (ref 4.0–10.5)

## 2017-06-21 LAB — BASIC METABOLIC PANEL
Anion gap: 8 (ref 5–15)
BUN: 17 mg/dL (ref 6–20)
CO2: 27 mmol/L (ref 22–32)
CREATININE: 0.76 mg/dL (ref 0.44–1.00)
Calcium: 9.4 mg/dL (ref 8.9–10.3)
Chloride: 104 mmol/L (ref 101–111)
GFR calc Af Amer: >60 mL/min (ref >60)
GFR calc non Af Amer: >60 mL/min (ref >60)
GLUCOSE: 98 mg/dL (ref 65–99)
Potassium: 4.1 mmol/L (ref 3.5–5.1)
SODIUM: 139 mmol/L (ref 135–145)

## 2017-06-21 LAB — I-STAT TROPONIN, ED
TROPONIN I, POC: 0.01 ng/mL (ref 0.00–0.08)
Troponin i, poc: 0 ng/mL (ref 0.00–0.08)

## 2017-06-21 NOTE — Discharge Instructions (Signed)
Your EKG, lab work and chest x-ray were reassuring.  Please call your cardiologist or primary care doctor for follow-up of your ER visit today.  Your blood pressure was also mildly elevated, please have this rechecked.  Return to the emergency department if your chest pain is worse with strenuous activity, associated with sweating or nausea, radiates to the left arm/jaw, worsens or becomes concerning in any way.

## 2017-06-21 NOTE — ED Triage Notes (Signed)
Pt reports L-sided CP radiating to neck with intermittent pain in RUE, describes as "pressure." Pt denies n/v, dizziness, endorses mild SOB, worse with positioning. Pt reports hx disrhymia.  Resp e/u at this time.

## 2017-06-21 NOTE — ED Provider Notes (Signed)
[facility] Provider Note   CSN: [id number] Arrival date & time: 06/21/17  1143     History   Chief Complaint Chief Complaint  Patient presents with  . Chest Pain    HPI [name] is a 46 y.o. female.  HPI  [name] is a 46yo female with a history of HTN, palpitations, anxiety, migraines, ADD who presents to the emergency department for evaluation of substernal chest tightness.  Patient states she woke up this morning feeling congested in her neck, as the morning went on this sensation moved to her mid chest. States that at this time she feels tightness in her chest which does not radiate. She reports that she tried using her albuterol inhaler without significant improvement. Reports that tightness is worsened with bending over, twisting or palpating the chest wall. States that she felt some shortness of breath earlier, but this has since improved.  Denies associated nausea/vomiting, lightheadedness, diaphoresis, cough, fever, abdominal pain, numbness, weakness, headache. Denies recent injury to the chest wall or strenuous activity.  Denies history of DVT/PE, denies leg swelling, denies pleuritic chest pain, denies exogenous estrogen use, denies recent surgery or immobility.  Denies smoking history.  Denies history of exertional chest pain.  She states that her maternal grandfather had a heart attack in his 1750s.  Also reports increased stress recently due to relationship problems.   Per chart review, patient has been seen by cardiologist Dr. Donato SchultzMark Skains for irregular heart beat in the past. Had 7 beats of non-sustained ventricular tachycardia on Holter monitor in 2010. Had an echocardiogram 07/30/2016   Past Medical History:  Diagnosis Date  . ADD (attention deficit disorder)   . ALLERGIC RHINITIS 10/10/2007   Qualifier: Diagnosis of  By: Craige CottaSood MD, Vineet    . Anxiety   . Chest pain   . Hypertension   . Intrinsic asthma, unspecified 10/10/2007     Qualifier: Diagnosis of  By: Craige CottaSood MD, Vineet    . Migraines   . Palpitations   . Peptic ulcer   . VT (ventricular tachycardia) [facility](HCC)     Patient Active Problem List   Diagnosis Date Noted  . Chest pain 06/25/2016  . ALLERGIC RHINITIS 10/10/2007  . INTRINSIC ASTHMA, UNSPECIFIED 10/10/2007    Past Surgical History:  Procedure Laterality Date  . CESAREAN SECTION    . TUBAL LIGATION      OB History    No data available       Home Medications    Prior to Admission medications   Medication Sig Start Date End Date Taking? Authorizing Provider  albuterol (PROVENTIL HFA;VENTOLIN HFA) 108 (90 Base) MCG/ACT inhaler Inhale 1 puff into the lungs as needed for wheezing or shortness of breath.    [provider]  amphetamine-dextroamphetamine (ADDERALL) 30 MG tablet Take 30 mg by mouth 2 (two) times daily.     [provider]  carvedilol (COREG) 6.25 MG tablet Take 1 tablet (6.25 mg total) by mouth 2 (two) times daily. 08/20/16   Janetta Horahompson, Kathryn R, PA-C  cetirizine (ZYRTEC) 10 MG tablet Take 10 mg by mouth daily.    [provider]  Multiple Vitamin (MULTIVITAMINS PO) Take 1 tablet by mouth daily.     [provider]  naproxen sodium (ANAPROX) 220 MG tablet Take 220 mg by mouth as needed (for pain).     [provider]  nebivolol (BYSTOLIC) 5 MG tablet Take 1 tablet (5 mg total) by mouth daily. 12/04/16  Janetta Hora, PA-C  rOPINIRole (REQUIP) 1 MG tablet Take 1.5 tablets by mouth at bedtime. 06/27/16   [provider]    Family History Family History  Problem Relation Age of Onset  . Hypertension Mother   . Heart attack Mother   . Heart disease Mother   . Diabetes Mother   . Emphysema Mother   . Asthma Mother   . Anemia Sister   . Heart attack Maternal Uncle   . Allergic rhinitis Daughter   . ADD / ADHD Daughter   . Other Daughter        keratosis pilaris, chest pain,nevus-neoplastic  . ADD / ADHD Son      Social History Social History   Tobacco Use  . Smoking status: Former Smoker    Types: Cigarettes    Last attempt to quit: 06/25/1990    Years since quitting: 27.0  . Smokeless tobacco: Never Used  Substance Use Topics  . Alcohol use: Yes    Alcohol/week: 4.2 - 5.4 oz    Types: 7 - 9 Glasses of wine per week  . Drug use: No     Allergies   Escitalopram oxalate; Estrogens; Pseudoephedrine; Imitrex [sumatriptan]; Mirapex [pramipexole dihydrochloride]; Strattera [atomoxetine hcl]; Sudafed [pseudoephedrine hcl]; and Bupropion hcl   Review of Systems Review of Systems  Constitutional: Negative for chills, fatigue and fever.  HENT: Positive for congestion. Negative for sore throat.   Eyes: Negative for visual disturbance.  Respiratory: Positive for chest tightness and shortness of breath. Negative for cough and wheezing.   Cardiovascular: Negative for chest pain, palpitations and leg swelling.  Gastrointestinal: Negative for abdominal pain, nausea and vomiting.  Genitourinary: Negative for difficulty urinating and dysuria.  Musculoskeletal: Negative for back pain and gait problem.  Skin: Negative for rash.  Neurological: Negative for dizziness, weakness, light-headedness, numbness and headaches.  Psychiatric/Behavioral: Negative for agitation.     Physical Exam Updated Vital Signs BP (!) 148/89   Pulse 85   Temp 98.3 F (36.8 C) (Oral)   Resp 19   Ht 5\' 5"  (1.651 m)   Wt 80.7 kg (178 lb)   SpO2 100%   BMI 29.62 kg/m   Physical Exam  Constitutional: She is oriented to person, place, and time. She appears well-developed and well-nourished.  Non-toxic appearance. She does not appear ill. No distress.  Sitting comfortably at the bedside. In no apparent distress.   HENT:  Head: Normocephalic and atraumatic.  Eyes: Pupils are equal, round, and reactive to light. Right eye exhibits no discharge. Left eye exhibits no discharge.  Neck: Normal range of motion. Neck  supple. No JVD present. No tracheal deviation present.  Cardiovascular: Normal rate and regular rhythm. Exam reveals no distant heart sounds and no friction rub.  Murmur (systolic) heard. Pulses:      Radial pulses are 2+ on the right side, and 2+ on the left side.       Dorsalis pedis pulses are 2+ on the right side, and 2+ on the left side.       Posterior tibial pulses are 2+ on the right side, and 2+ on the left side.  Pulmonary/Chest: Effort normal and breath sounds normal. No accessory muscle usage. No respiratory distress. She has no decreased breath sounds. She has no wheezes. She has no rhonchi. She has no rales.  TTP grossly over the chest wall.   Abdominal: Soft. Bowel sounds are normal. She exhibits no distension and no mass. There is no tenderness. There is  no guarding.  Musculoskeletal: Normal range of motion.       Right lower leg: Normal. She exhibits no tenderness and no edema.       Left lower leg: Normal. She exhibits no tenderness and no edema.  Neurological: She is alert and oriented to person, place, and time. Coordination normal.  Skin: Skin is warm and dry. Capillary refill takes less than 2 seconds. No rash noted. She is not diaphoretic. No erythema.  Psychiatric: She has a normal mood and affect. Her behavior is normal.  Nursing note and vitals reviewed.    ED Treatments / Results  Labs (all labs ordered are listed, but only abnormal results are displayed) Labs Reviewed  BASIC METABOLIC PANEL  CBC  I-STAT TROPONIN, ED  I-STAT BETA HCG BLOOD, ED ([facility], [facility], [facility] ONLY)  I-STAT TROPONIN, ED    EKG  EKG Interpretation  Date/Time:  Friday June 21 2017 11:49:38 EST Ventricular Rate:  97 PR Interval:  146 QRS Duration: 80 QT Interval:  336 QTC Calculation: 426 R Axis:   69 Text Interpretation:  Normal sinus rhythm Normal ECG No old tracing to compare Confirmed by Melene PlanFloyd, Dan [phone]([id number]) on 06/21/2017 1:26:54 PM       Radiology Dg Chest 2 View  Result  Date: 06/21/2017 CLINICAL DATA:  Chest pain and pressure. EXAM: CHEST  2 VIEW COMPARISON:  10/10/2007 FINDINGS: Heart size is normal. Mediastinal shadows are normal. The lungs are clear. No bronchial thickening. No infiltrate, mass, effusion or collapse. Pulmonary vascularity is normal. No bony abnormality. IMPRESSION: Normal chest Electronically Signed   By: Paulina FusiMark  Shogry M.D.   On: 06/21/2017 12:26    Procedures Procedures (including critical care time)  Medications Ordered in ED Medications - No data to display   Initial Impression / Assessment and Plan / ED Course  I have reviewed the triage vital signs and the nursing notes.  Pertinent labs & imaging results that were available during my care of the patient were reviewed by me and considered in my medical decision making (see chart for details).    Patient is to be discharged with recommendation to follow up with PCP in regards to today's hospital visit. Chest pain is not likely of cardiac or pulmonary etiology d/t presentation, PERC negative, VSS, no tracheal deviation, no JVD or new murmur, RRR, breath sounds equal bilaterally, EKG without acute abnormalities, negative delta troponin, and negative CXR. Pt has been advised to return to the ED if CP becomes exertional, associated with diaphoresis or nausea, radiates to left jaw/arm, worsens or becomes concerning in any way. Her blood pressure was mildly elevated in the ER today, counseled her to have this rechecked and patient agrees. Pt appears reliable for follow up and is agreeable to discharge. Case has been discussed with Dr. Adela LankFloyd who agrees with the above plan to discharge.   Final Clinical Impressions(s) / ED Diagnoses   Final diagnoses:  Chest pain, unspecified type    ED Discharge Orders    None       Kellie ShropshireShrosbree, Emily J, PA-C 06/22/17 1411    Melene PlanFloyd, Dan, DO 06/24/17 1458

## 2017-08-07 DIAGNOSIS — Z719 Counseling, unspecified: Secondary | ICD-10-CM | POA: Diagnosis not present

## 2017-08-15 ENCOUNTER — Encounter: Payer: Self-pay | Admitting: Cardiology

## 2017-08-15 ENCOUNTER — Ambulatory Visit: Payer: 59 | Admitting: Cardiology

## 2017-08-15 VITALS — BP 122/66 | HR 89 | Ht 65.0 in | Wt 190.5 lb

## 2017-08-15 DIAGNOSIS — I10 Essential (primary) hypertension: Secondary | ICD-10-CM | POA: Diagnosis not present

## 2017-08-15 DIAGNOSIS — R002 Palpitations: Secondary | ICD-10-CM

## 2017-08-15 MED ORDER — NEBIVOLOL HCL 10 MG PO TABS: 10.0000 mg | ORAL_TABLET | Freq: Every day | ORAL | 3 refills | Status: DC

## 2017-08-15 NOTE — Progress Notes (Signed)
Cardiology Office Note    Date:  08/15/2017   ID:  [name], DOB [birth year], MRN [id number]  PCP:  Sigmund Hazel, MD  Cardiologist:   Donato Schultz, MD     History of Present Illness:  [name] is a 47 y.o. female here for follow up of CP,  evaluation of irregular heartbeat, PVCs. She had seen me in the past in 2010. She sees Dr. Sigmund Hazel. She has been taking Adderall.  She had 7 beats of nonsustained ventricular tachycardia on a Holter monitor with negative exercise treadmill test and normal echocardiogram back in 2010. She will feel an occasional fluttering sensation when laying down. No syncope. She was on metoprolol 12.5 mg twice a day. She is no longer taking because this made her feel tired.  Overall, she is noted that his blood pressures have been increased, sometimes in the 160 systolic. She has been feeling skipped beats as well. Once again no syncope. Her daughter is also seeing me later today.   [place] felt like her blood pressure was elevated. This made her more anxious.  08/15/17: She was in the emergency department on 06/21/17 with substernal chest tightness waking up with congestion in her neck moving to the mid chest.  Did not radiate.  She tried to use her albuterol without significant improvement.  Seem to be worse when bending over or twisting or palpating the chest wall.  No smoking history.  Her maternal grandfather had a heart attack in his 71s.  She was under increased stress.  Her EKG on 06/2017 was normal rhythm without any other abnormalities personally viewed.  Troponins were normal.  She has not been having any further chest discomfort but she does awaken sometimes at night with palpitations again.  She gets nervous with this.  She also has been checking her blood pressure at work and at home it is been elevated in the 150s at times.  She also has an apple watch for and she showed me some tracings.  Most  of them look like normal sinus rhythm.  1 of them may have had a PVC.  Past Medical History:  Diagnosis Date  . ADD (attention deficit disorder)   . ALLERGIC RHINITIS 10/10/2007   Qualifier: Diagnosis of  By: Craige Cotta MD, Vineet    . Anxiety   . Chest pain   . Hypertension   . Intrinsic asthma, unspecified 10/10/2007   Qualifier: Diagnosis of  By: Craige Cotta MD, Vineet    . Migraines   . Palpitations   . Peptic ulcer   . VT (ventricular tachycardia) (HCC)     Past Surgical History:  Procedure Laterality Date  . CESAREAN SECTION    . TUBAL LIGATION      Current Medications: Outpatient Medications Prior to Visit  Medication Sig Dispense Refill  . albuterol (PROVENTIL HFA;VENTOLIN HFA) 108 (90 Base) MCG/ACT inhaler Inhale 1 puff into the lungs as needed for wheezing or shortness of breath.    . amphetamine-dextroamphetamine (ADDERALL) 30 MG tablet Take 30 mg by mouth 2 (two) times daily.     . cetirizine (ZYRTEC) 10 MG tablet Take 10 mg by mouth daily.    . Multiple Vitamin (MULTIVITAMINS PO) Take 1 tablet by mouth daily.     . naproxen sodium (ANAPROX) 220 MG tablet Take 220 mg by mouth as needed (for pain).     Marland Kitchen rOPINIRole (REQUIP) 1 MG tablet  Take 1.5 tablets by mouth at bedtime.    . nebivolol (BYSTOLIC) 5 MG tablet Take 1 tablet (5 mg total) by mouth daily. 90 tablet 3  . carvedilol (COREG) 6.25 MG tablet Take 1 tablet (6.25 mg total) by mouth 2 (two) times daily. (Patient not taking: Reported on 08/15/2017) 180 tablet 3   No facility-administered medications prior to visit.      Allergies:   Escitalopram oxalate; Estrogens; Pseudoephedrine; Imitrex [sumatriptan]; Mirapex [pramipexole dihydrochloride]; Strattera [atomoxetine hcl]; Sudafed [pseudoephedrine hcl]; and Bupropion hcl   Social History   Socioeconomic History  . Marital status: Married    Spouse name: None  . Number of children: None  . Years of education: None  . Highest education level: None  Social Needs  . Financial  resource strain: None  . Food insecurity - worry: None  . Food insecurity - inability: None  . Transportation needs - medical: None  . Transportation needs - non-medical: None  Occupational History  . None  Tobacco Use  . Smoking status: Former Smoker    Types: Cigarettes    Last attempt to quit: 06/25/1990    Years since quitting: 27.1  . Smokeless tobacco: Never Used  Substance and Sexual Activity  . Alcohol use: Yes    Alcohol/week: 4.2 - 5.4 oz    Types: 7 - 9 Glasses of wine per week  . Drug use: No  . Sexual activity: Yes  Other Topics Concern  . None  Social History Narrative  . None     Family History:  The patient's family history includes ADD / ADHD in her daughter and son; Allergic rhinitis in her daughter; Anemia in her sister; Asthma in her mother; Diabetes in her mother; Emphysema in her mother; Heart attack in her maternal uncle and mother; Heart disease in her mother; Hypertension in her mother; Other in her daughter.  Her mother died age 106-cardiac arrest. CAD in uncle. ROS:   Please see the history of present illness.    Review of Systems  All other systems reviewed and are negative.     PHYSICAL EXAM:   VS:  BP 122/66   Pulse 89   Ht 5\' 5"  (1.651 m)   Wt 190 lb 8 oz (86.4 kg)   SpO2 98%   BMI 31.70 kg/m     GEN: Well nourished, well developed, in no acute distress  HEENT: anterior neck scar Neck: no JVD, carotid bruits, or masses Cardiac: RRR; 1/6 SM, no rubs, or gallops,no edema  Respiratory:  clear to auscultation bilaterally, normal work of breathing GI: soft, nontender, nondistended, + BS MS: no deformity or atrophy  Skin: warm and dry, no rash Neuro:  Alert and Oriented x 3, Strength and sensation are intact Psych: euthymic mood, full affect    Wt Readings from Last 3 Encounters:  08/15/17 190 lb 8 oz (86.4 kg)  06/21/17 178 lb (80.7 kg)  08/20/16 178 lb 12.8 oz (81.1 kg)      Studies/Labs Reviewed:   EKG:  07/16/16-sinus  tachycardia rate 101 bpm with no other abnormality, QT interval 334 ms.  Recent Labs: 06/21/2017: BUN 17; Creatinine, Ser 0.76; Hemoglobin 15.0; Platelets 266; Potassium 4.1; Sodium 139   Lipid Panel No results found for: CHOL, TRIG, HDL, CHOLHDL, VLDL, LDLCALC, LDLDIRECT  Additional studies/ records that were reviewed today include:  Prior office notes, echocardiogram, monitor reviewed  ECHO 07/30/16: - Left ventricle: The cavity size was normal. Systolic function was   normal. The estimated  ejection fraction was in the range of 55%   to 60%. Wall motion was normal; there were no regional wall   motion abnormalities. Left ventricular diastolic function   parameters were normal.  Holter 07/20/16:  Rare PVC (3) and PAC's (27)  Brief atrial tachycardia (5 beats)  No atrial fibrillation  No ventricular tachycardia  Reassuring monitor.   ASSESSMENT:    1. Palpitations   2. Essential hypertension      PLAN:  In order of problems listed above:  Palpitations  - In the past, has been diagnosed with PVCs and actually had a brief run of NSVT, 7 beats on a Holter monitor. Her complete workup at the time with echocardiogram and exercise treadmill test was also reassuring. She was placed on low-dose beta blocker at the time that subsequent stopped because of fatigue.  -Bystolic 5 mg will increase to 10 mg today which can help both with her blood pressure as well as palpitations.  - Echocardiogram, soft systolic murmur appreciated, reassuring in 2018.  - 24-hour Holter monitor in 2018 was reassuring. PAC/PVC.  We will increase Bystolic from 5-10.  - Adderall certainly can increase overall heart rate in lead to more palpitations however she benefits greatly from this. Do not need to stop.  Nonsustained ventricular tachycardia  - Past seen on Holter years ago. No syncope. No other high-risk symptoms. Bystolic Last monitor was reassuring.   Essential hypertension  - Blood pressure  at one point was quite low she states. Now elevated on a consistent basis.  - Bystolic increased from 5-10.  This will help her with her blood pressure as well as palpitations.  If she wishes to change because of cost, Bystolic has been $35, we could try her on metoprolol succinate 50 and lisinopril 10.  1 month with APP.   Medication Adjustments/Labs and Tests Ordered: Current medicines are reviewed at length with the patient today.  Concerns regarding medicines are outlined above.  Medication changes, Labs and Tests ordered today are listed in the Patient Instructions below. Patient Instructions  Medication Instructions:  Please increase Bystolic to 10 mg a day. Continue all other medications as listed. Follow-Up: Follow up in 1 month with Nada Boozer, NP.  If you need a refill on your cardiac medications before your next appointment, please call your pharmacy.  Thank you for choosing [facility]!!        Signed, Donato Schultz, MD  08/15/2017 11:41 AM    [facility] [address] [place], [place], [place]  [address] Phone: [phone]; Fax: [phone]

## 2017-08-15 NOTE — Patient Instructions (Addendum)
Medication Instructions:  Please increase Bystolic to 10 mg a day. Continue all other medications as listed.  Follow-Up: Follow up in 1 month with Nada BoozerLaura Ingold, NP.  If you need a refill on your cardiac medications before your next appointment, please call your pharmacy.  Thank you for choosing [facility]!!

## 2017-08-20 DIAGNOSIS — Z719 Counseling, unspecified: Secondary | ICD-10-CM | POA: Diagnosis not present

## 2017-08-26 DIAGNOSIS — J4520 Mild intermittent asthma, uncomplicated: Secondary | ICD-10-CM | POA: Diagnosis not present

## 2017-08-26 DIAGNOSIS — R002 Palpitations: Secondary | ICD-10-CM | POA: Diagnosis not present

## 2017-08-27 DIAGNOSIS — Z719 Counseling, unspecified: Secondary | ICD-10-CM | POA: Diagnosis not present

## 2017-09-03 DIAGNOSIS — Z719 Counseling, unspecified: Secondary | ICD-10-CM | POA: Diagnosis not present

## 2017-09-15 NOTE — Progress Notes (Signed)
Cardiology Office Note   Date:  09/16/2017    ID:  Meghan CrossSonya R Ramos, DOB [birth year], MRN [id number]  PCP:  Sigmund HazelMiller, Lisa, MD  Cardiologist:  [facility]     Chief Complaint  Patient presents with  . Palpitations      History of Present Illness: [name] is a 47 y.o. female who presents for palpitations.   Last seen by DR. Skains 08/15/17  HTN, 7 beats VT on holter.  Neg echo and ETT in past , bystolic increased to 10 mg she is on adderall. Follow up today  Today she complains of some chest pain brief episodes that come and go but are bothersome.  Described as heaviness at regular intervals. Is more aware of this at night.  Occurs lt ant chest.  She had ETT 5 years ago.  No syncope.  palpitations seem improved. No associated symptoms with the chest heaviness of SOB, nausea or diaphoresis.   Past Medical History:  Diagnosis Date  . ADD (attention deficit disorder)   . ALLERGIC RHINITIS 10/10/2007   Qualifier: Diagnosis of  By: Craige CottaSood MD, Vineet    . Anxiety   . Chest pain   . Hypertension   . Intrinsic asthma, unspecified 10/10/2007   Qualifier: Diagnosis of  By: Craige CottaSood MD, Vineet    . Migraines   . Palpitations   . Peptic ulcer   . VT (ventricular tachycardia) (HCC)     Past Surgical History:  Procedure Laterality Date  . CESAREAN SECTION    . TUBAL LIGATION       Current Outpatient Medications  Medication Sig Dispense Refill  . albuterol (PROVENTIL HFA;VENTOLIN HFA) 108 (90 Base) MCG/ACT inhaler Inhale 1 puff into the lungs as needed for wheezing or shortness of breath.    . amphetamine-dextroamphetamine (ADDERALL) 30 MG tablet Take 30 mg by mouth 2 (two) times daily.     . cetirizine (ZYRTEC) 10 MG tablet Take 10 mg by mouth daily.    . cyclobenzaprine (FLEXERIL) 10 MG tablet Take 10 mg by mouth 3 (three) times daily as needed for muscle spasms.    . Multiple Vitamin (MULTIVITAMINS PO) Take 1 tablet by mouth daily.     . naproxen sodium (ANAPROX) 220 MG tablet Take 220  mg by mouth as needed (for pain).     . nebivolol (BYSTOLIC) 10 MG tablet Take 1 tablet (10 mg total) by mouth daily. 90 tablet 3  . rOPINIRole (REQUIP) 1 MG tablet Take 1.5 tablets by mouth at bedtime.     No current facility-administered medications for this visit.     Allergies:   Escitalopram oxalate; Estrogens; Pseudoephedrine; Imitrex [sumatriptan]; Mirapex [pramipexole dihydrochloride]; Strattera [atomoxetine hcl]; Sudafed [pseudoephedrine hcl]; and Bupropion hcl    Social History:  The patient  reports that she quit smoking about 27 years ago. Her smoking use included cigarettes. she has never used smokeless tobacco. She reports that she drinks about 4.2 - 5.4 oz of alcohol per week. She reports that she does not use drugs.   Family History:  The patient's family history includes ADD / ADHD in her daughter and son; Allergic rhinitis in her daughter; Anemia in her sister; Asthma in her mother; Diabetes in her mother; Emphysema in her mother; Heart attack in her maternal uncle and mother; Heart disease in her mother; Hypertension in her mother; Other in her daughter.    ROS:  General:no colds or fevers, no weight changes Skin:no rashes or ulcers HEENT:no blurred vision, no congestion  CV:see HPI PUL:see HPI GI:no diarrhea constipation or melena, no indigestion GU:no hematuria, no dysuria MS:no joint pain, no claudication Neuro:no syncope, no lightheadedness Endo:no diabetes, no thyroid disease  Wt Readings from Last 3 Encounters:  09/16/17 189 lb 6.4 oz (85.9 kg)  08/15/17 190 lb 8 oz (86.4 kg)  06/21/17 178 lb (80.7 kg)     PHYSICAL EXAM: VS:  BP 114/70   Pulse 66   Ht 5\' 5"  (1.651 m)   Wt 189 lb 6.4 oz (85.9 kg)   BMI 31.52 kg/m  , BMI Body mass index is 31.52 kg/m. General:Pleasant affect, NAD Skin:Warm and dry, brisk capillary refill HEENT:normocephalic, sclera clear, mucus membranes moist Neck:supple, no JVD, no bruits  Heart:S1S2 RRR without murmur, gallup, rub  or click Lungs:clear without rales, rhonchi, or wheezes ZOX:WRUE, non tender, + BS, do not palpate liver spleen or masses Ext:no lower ext edema, 2+ pedal pulses, 2+ radial pulses Neuro:alert and oriented, MAE, follows commands, + facial symmetry    EKG:  EKG is NOT ordered today.    Recent Labs: 06/21/2017: BUN 17; Creatinine, Ser 0.76; Hemoglobin 15.0; Platelets 266; Potassium 4.1; Sodium 139    Lipid Panel No results found for: CHOL, TRIG, HDL, CHOLHDL, VLDL, LDLCALC, LDLDIRECT     Other studies Reviewed: Additional studies/ records that were reviewed today include: . Echo study 07/30/16 Study Conclusions  - Left ventricle: The cavity size was normal. Systolic function was   normal. The estimated ejection fraction was in the range of 55%   to 60%. Wall motion was normal; there were no regional wall   motion abnormalities. Left ventricular diastolic function   parameters were normal.  ASSESSMENT AND PLAN:  1.  Chest pain, may be muscular skeletal but will check ETT, her EKGs have been normal.  Follow up with Dr. Anne Fu.  2.  Palpations improved with increase of bystolic.    3.  Hx on NSVT   4.  HTN controlled   Current medicines are reviewed with the patient today.  The patient Has no concerns regarding medicines.  The following changes have been made:  See above Labs/ tests ordered today include:see above  Disposition:   FU:  see above  Signed, Nada Boozer, NP  09/16/2017 9:26 AM    [facility] [address] [place], [place], [place]  [address]/ [address] [place], [place] Phone: [phone]; Fax: [phone]  [phone]

## 2017-09-16 ENCOUNTER — Ambulatory Visit: Payer: 59 | Admitting: Cardiology

## 2017-09-16 ENCOUNTER — Encounter: Payer: Self-pay | Admitting: Cardiology

## 2017-09-16 VITALS — BP 114/70 | HR 66 | Ht 65.0 in | Wt 189.4 lb

## 2017-09-16 DIAGNOSIS — I10 Essential (primary) hypertension: Secondary | ICD-10-CM | POA: Diagnosis not present

## 2017-09-16 DIAGNOSIS — I472 Ventricular tachycardia: Secondary | ICD-10-CM

## 2017-09-16 DIAGNOSIS — R002 Palpitations: Secondary | ICD-10-CM | POA: Diagnosis not present

## 2017-09-16 DIAGNOSIS — R0789 Other chest pain: Secondary | ICD-10-CM

## 2017-09-16 DIAGNOSIS — I4729 Other ventricular tachycardia: Secondary | ICD-10-CM

## 2017-09-16 NOTE — Patient Instructions (Signed)
Medication Instructions:  Your physician recommends that you continue on your current medications as directed. Please refer to the Current Medication list given to you today.   Labwork: NONE ORDERED TODAY  Testing/Procedures: Your physician has requested that you have an exercise tolerance test. For further information please visit [email or web address]. Please also follow instruction sheet, as given.    Follow-Up: 2 MONTHS WITH DR. Anne FuSKAINS   Any Other Special Instructions Will Be Listed Below (If Applicable).     If you need a refill on your cardiac medications before your next appointment, please call your pharmacy.

## 2017-10-08 ENCOUNTER — Ambulatory Visit: Payer: 59

## 2017-10-08 DIAGNOSIS — R0789 Other chest pain: Secondary | ICD-10-CM | POA: Diagnosis not present

## 2017-10-08 LAB — EXERCISE TOLERANCE TEST
CHL CUP MPHR: 172 {beats}/min
CHL CUP RESTING HR STRESS: 81 {beats}/min
CSEPEW: 8.5 METS
CSEPPHR: 166 {beats}/min
Exercise duration (min): 7 min
Exercise duration (sec): 0 s
Percent HR: 96 %
RPE: 18

## 2017-10-09 ENCOUNTER — Telehealth: Payer: Self-pay | Admitting: *Deleted

## 2017-10-09 DIAGNOSIS — I10 Essential (primary) hypertension: Secondary | ICD-10-CM

## 2017-10-09 DIAGNOSIS — R0789 Other chest pain: Secondary | ICD-10-CM

## 2017-10-09 DIAGNOSIS — R002 Palpitations: Secondary | ICD-10-CM

## 2017-10-09 MED ORDER — METOPROLOL TARTRATE 50 MG PO TABS: 50.0000 mg | ORAL_TABLET | Freq: Once | ORAL | 0 refills | Status: DC

## 2017-10-09 NOTE — Telephone Encounter (Signed)
Notes recorded by Leone BrandIngold, Laura R, NP on 10/09/2017 at 7:50 AM EDT Please tell pt her stress test had some questionable results, so Dr. Anne FuSkains want cardiac CT with Ca+ score and FFR. This will give more accurate information concerning CAD with her abnormal ETT. Thanks-- she can have the 50 mg lopressor prior to study. ------  Notes recorded by Jake BatheSkains, Mark C, MD on 10/09/2017 at 7:13 AM EDT Why don't we get a coronary CTA. Give lopressor 50 x 1 prior to procedure.  Donato SchultzMark Skains, MD  Ordered coronary CTA, Lopressor and required BMP. Pt aware of the orders and instructions.  Once precert has been obtained she is aware she will be called to schedule.  She will have blood work completed at that time and pick up the RX for Lopressor 50 mg once 1 hour prior to testing.  All questions answered to patient's satisfaction and she will c/b if further questions or concerns.    Please [facility] main [facility] of [facility] at xx:xx AM (30-45 minutes prior to test start time)  [facility]  [address] [place], [place] [address] [phone]  Proceed to the [facility] Radiology Department (First Floor).  Please follow these instructions carefully (unless otherwise directed):  Hold all erectile dysfunction medications at least 48 hours prior to test.  On the Night Before the Test: . Drink plenty of water. . Do not consume any caffeinated/decaffeinated beverages or chocolate 12 hours prior to your test. . Do not take any antihistamines 12 hours prior to your test. . If the patient has contrast allergy: ? Patient will need a prescription for Prednisone and very clear instructions (as follows): 1. Prednisone 50 mg - take 13 hours prior to test 2. Take another Prednisone 50 mg 7 hours prior to test 3. Take another Prednisone 50 mg 1 hour prior to test 4. Take Benadryl 50 mg 1 hour prior to test . Patient must complete all four doses of above prophylactic  medications. . Patient will need a ride after test due to Benadryl.  On the Day of the Test: . Drink plenty of water. Do not drink any water within one hour of the test. . Do not eat any food 4 hours prior to the test. . You may take your regular medications prior to the test. . IF NOT ON A BETA BLOCKER - Take 50 mg of lopressor (metoprolol) one hour before the test. . HOLD Furosemide morning of the test.  After the Test: . Drink plenty of water. . After receiving IV contrast, you may experience a mild flushed feeling. This is normal. . On occasion, you may experience a mild rash up to 24 hours after the test. This is not dangerous. If this occurs, you can take Benadryl 25 mg and increase your fluid intake. . If you experience trouble breathing, this can be serious. If it is severe call 911 IMMEDIATELY. If it is mild, please call our office. . If you take any of these medications: Glipizide/Metformin, Avandament, Glucavance, please do not take 48 hours after completing test.

## 2017-10-29 DIAGNOSIS — Z01419 Encounter for gynecological examination (general) (routine) without abnormal findings: Secondary | ICD-10-CM | POA: Diagnosis not present

## 2017-11-12 ENCOUNTER — Ambulatory Visit: Payer: 59 | Admitting: Cardiology

## 2017-11-13 DIAGNOSIS — M542 Cervicalgia: Secondary | ICD-10-CM | POA: Diagnosis not present

## 2017-11-13 DIAGNOSIS — M7541 Impingement syndrome of right shoulder: Secondary | ICD-10-CM | POA: Diagnosis not present

## 2017-11-13 DIAGNOSIS — M25511 Pain in right shoulder: Secondary | ICD-10-CM | POA: Diagnosis not present

## 2017-11-25 DIAGNOSIS — N888 Other specified noninflammatory disorders of cervix uteri: Secondary | ICD-10-CM | POA: Diagnosis not present

## 2017-12-04 DIAGNOSIS — R102 Pelvic and perineal pain: Secondary | ICD-10-CM | POA: Diagnosis not present

## 2017-12-18 ENCOUNTER — Encounter (INDEPENDENT_AMBULATORY_CARE_PROVIDER_SITE_OTHER): Payer: Self-pay

## 2017-12-27 ENCOUNTER — Ambulatory Visit (HOSPITAL_COMMUNITY): Payer: 59

## 2018-01-17 ENCOUNTER — Encounter (HOSPITAL_COMMUNITY): Payer: Self-pay

## 2018-01-17 ENCOUNTER — Ambulatory Visit (HOSPITAL_COMMUNITY): Admission: RE | Admit: 2018-01-17 | Payer: 59 | Source: Ambulatory Visit

## 2018-01-17 ENCOUNTER — Ambulatory Visit (HOSPITAL_COMMUNITY)
Admission: RE | Admit: 2018-01-17 | Discharge: 2018-01-17 | Disposition: A | Discharge status: Home / Self Care | Payer: 59 | Source: Ambulatory Visit | Attending: Cardiology | Admitting: Cardiology

## 2018-01-17 DIAGNOSIS — I472 Ventricular tachycardia: Secondary | ICD-10-CM

## 2018-01-17 DIAGNOSIS — R0789 Other chest pain: Secondary | ICD-10-CM

## 2018-01-17 DIAGNOSIS — R002 Palpitations: Secondary | ICD-10-CM | POA: Diagnosis not present

## 2018-01-17 MED ORDER — IOPAMIDOL (ISOVUE-370) INJECTION 76%
INTRAVENOUS | Status: AC
  Filled 2018-01-17: qty 100

## 2018-01-17 MED ORDER — METOPROLOL TARTRATE 5 MG/5ML IV SOLN
5.0000 mg | Freq: Once | INTRAVENOUS | Status: AC
  Administered 2018-01-17: 5 mg via INTRAVENOUS
  Filled 2018-01-17: qty 5

## 2018-01-17 MED ORDER — IOPAMIDOL (ISOVUE-370) INJECTION 76%
100.0000 mL | Freq: Once | INTRAVENOUS | Status: AC | PRN
  Administered 2018-01-17: 100 mL via INTRAVENOUS

## 2018-01-17 MED ORDER — NITROGLYCERIN 0.4 MG SL SUBL
0.8000 mg | SUBLINGUAL_TABLET | Freq: Once | SUBLINGUAL | Status: AC
  Administered 2018-01-17: 0.8 mg via SUBLINGUAL
  Filled 2018-01-17: qty 25

## 2018-01-17 MED ORDER — METOPROLOL TARTRATE 5 MG/5ML IV SOLN
INTRAVENOUS | Status: AC
  Filled 2018-01-17: qty 5

## 2018-01-17 MED ORDER — NITROGLYCERIN 0.4 MG SL SUBL
SUBLINGUAL_TABLET | SUBLINGUAL | Status: AC
  Filled 2018-01-17: qty 1

## 2018-01-21 ENCOUNTER — Ambulatory Visit: Payer: 59 | Admitting: Cardiology

## 2018-01-22 ENCOUNTER — Encounter: Payer: Self-pay | Admitting: Cardiology

## 2018-02-11 NOTE — Addendum Note (Signed)
Encounter addended by: Scherry RanEspinosa, Choice Kleinsasser P, RT on: 02/11/2018 5:50 PM  Actions taken: Imaging Exam ended

## 2018-03-04 NOTE — Addendum Note (Signed)
Encounter addended by: Scherry RanEspinosa, Mindy Gali P, RT on: 03/04/2018 7:42 AM  Actions taken: Imaging Exam ended

## 2018-04-04 DIAGNOSIS — J4520 Mild intermittent asthma, uncomplicated: Secondary | ICD-10-CM | POA: Diagnosis not present

## 2018-04-04 DIAGNOSIS — G2581 Restless legs syndrome: Secondary | ICD-10-CM | POA: Diagnosis not present

## 2018-05-07 DIAGNOSIS — R87619 Unspecified abnormal cytological findings in specimens from cervix uteri: Secondary | ICD-10-CM | POA: Diagnosis not present

## 2018-05-07 DIAGNOSIS — R82998 Other abnormal findings in urine: Secondary | ICD-10-CM | POA: Diagnosis not present

## 2018-05-07 DIAGNOSIS — N760 Acute vaginitis: Secondary | ICD-10-CM | POA: Diagnosis not present

## 2018-08-04 DIAGNOSIS — J4521 Mild intermittent asthma with (acute) exacerbation: Secondary | ICD-10-CM | POA: Diagnosis not present

## 2018-08-18 ENCOUNTER — Other Ambulatory Visit: Payer: Self-pay | Admitting: Cardiology

## 2018-10-07 DIAGNOSIS — G2581 Restless legs syndrome: Secondary | ICD-10-CM | POA: Diagnosis not present

## 2018-10-07 DIAGNOSIS — J4520 Mild intermittent asthma, uncomplicated: Secondary | ICD-10-CM | POA: Diagnosis not present

## 2018-11-12 ENCOUNTER — Other Ambulatory Visit: Payer: Self-pay | Admitting: Cardiology

## 2019-09-10 ENCOUNTER — Other Ambulatory Visit: Payer: Self-pay

## 2019-09-10 MED ORDER — ATENOLOL 50 MG PO TABS: 50.0000 mg | ORAL_TABLET | Freq: Every day | ORAL | 0 refills | Status: DC

## 2019-09-12 ENCOUNTER — Other Ambulatory Visit: Payer: Self-pay | Admitting: Cardiology

## 2019-09-13 ENCOUNTER — Ambulatory Visit: Payer: 59

## 2019-09-13 ENCOUNTER — Ambulatory Visit: Payer: 59 | Attending: Internal Medicine

## 2019-09-13 DIAGNOSIS — Z23 Encounter for immunization: Secondary | ICD-10-CM

## 2019-09-13 NOTE — Progress Notes (Signed)
   Covid-19 Vaccination Clinic  Name:  KYMBER KOSAR    MRN: [id number] DOB: [birth year]  09/13/2019  Ms. Twaddell was observed post Covid-19 immunization for 15 minutes without incident. She was provided with Vaccine Information Sheet and instruction to access the V-Safe system.   Ms. Lajeunesse was instructed to call 911 with any severe reactions post vaccine: Marland Kitchen Difficulty breathing  . Swelling of face and throat  . A fast heartbeat  . A bad rash all over body  . Dizziness and weakness   Immunizations Administered    Name Date Dose VIS Date Route   Pfizer COVID-19 Vaccine 09/13/2019  6:46 PM 0.3 mL 06/19/2019 Intramuscular   Manufacturer: [employer], [employer]   Lot: [id number]   NDC: [address]-1

## 2019-09-18 ENCOUNTER — Other Ambulatory Visit: Payer: Self-pay | Admitting: *Deleted

## 2019-09-18 MED ORDER — ATENOLOL 50 MG PO TABS: 50.0000 mg | ORAL_TABLET | Freq: Every day | ORAL | 0 refills | Status: DC

## 2019-09-18 NOTE — Progress Notes (Signed)
Refill once only - pt must keep appt for further refills

## 2019-09-30 ENCOUNTER — Encounter: Payer: Self-pay | Admitting: Cardiology

## 2019-09-30 ENCOUNTER — Ambulatory Visit: Payer: 59 | Admitting: Cardiology

## 2019-09-30 ENCOUNTER — Other Ambulatory Visit: Payer: Self-pay

## 2019-09-30 VITALS — BP 132/72 | HR 82 | Ht 65.0 in | Wt 190.0 lb

## 2019-09-30 DIAGNOSIS — R0789 Other chest pain: Secondary | ICD-10-CM | POA: Diagnosis not present

## 2019-09-30 DIAGNOSIS — R002 Palpitations: Secondary | ICD-10-CM | POA: Diagnosis not present

## 2019-09-30 DIAGNOSIS — I10 Essential (primary) hypertension: Secondary | ICD-10-CM | POA: Diagnosis not present

## 2019-09-30 NOTE — Patient Instructions (Signed)
Medication Instructions:  The current medical regimen is effective;  continue present plan and medications.  *If you need a refill on your cardiac medications before your next appointment, please call your pharmacy*  Follow-Up: At [facility], you and your health needs are our priority.  As part of our continuing mission to provide you with exceptional heart care, we have created designated Provider Care Teams.  These Care Teams include your primary Cardiologist (physician) and Advanced Practice Providers (APPs -  Physician Assistants and Nurse Practitioners) who all work together to provide you with the care you need, when you need it.  We recommend signing up for the patient portal called "MyChart".  Sign up information is provided on this After Visit Summary.  MyChart is used to connect with patients for Virtual Visits (Telemedicine).  Patients are able to view lab/test results, encounter notes, upcoming appointments, etc.  Non-urgent messages can be sent to your provider as well.   To learn more about what you can do with MyChart, go to [email or web address].    Your next appointment:   12 month(s)  The format for your next appointment:   In Person  Provider:   Mark Skains, MD   Thank you for choosing [facility]!!      

## 2019-09-30 NOTE — Progress Notes (Signed)
Cardiology Office Note:    Date:  09/30/2019   ID:  [name], DOB Jan 31, 1971, MRN [id number]  PCP:  Meghan Lass, MD  Cardiologist:  Meghan Furbish, MD  Electrophysiologist:  None   Referring MD: Meghan Lass, MD    History of Present Illness:    [name] is a 49 y.o. female here for the follow-up of chest pain.  Back in 2018 was in the emergency department with discomfort.  Underwent increased stress.  Troponins normal.  Sometimes may feel some palpitations and nervousness.  Still feeling this at night occasionally.  She has her apple watch still.  No evidence of atrial fibrillation currently.  She is no longer on Bystolic.  Now taking atenolol.  Past Medical History:  Diagnosis Date  . ADD (attention deficit disorder)   . ALLERGIC RHINITIS 10/10/2007   Qualifier: Diagnosis of  By: Halford Chessman MD, [place]    . Anxiety   . Chest pain   . Hypertension   . Intrinsic asthma, unspecified 10/10/2007   Qualifier: Diagnosis of  By: Halford Chessman MD, [place]    . Migraines   . Palpitations   . Peptic ulcer   . VT (ventricular tachycardia) ([place])     Past Surgical History:  Procedure Laterality Date  . CESAREAN SECTION    . TUBAL LIGATION      Current Medications: Current Meds  Medication Sig  . albuterol (PROVENTIL HFA;VENTOLIN HFA) 108 (90 Base) MCG/ACT inhaler Inhale 1 puff into the lungs as needed for wheezing or shortness of breath.  . amphetamine-dextroamphetamine (ADDERALL) 30 MG tablet Take 30 mg by mouth 2 (two) times daily.   Marland Kitchen atenolol (TENORMIN) 50 MG tablet Take 1 tablet (50 mg total) by mouth daily.  . cetirizine (ZYRTEC) 10 MG tablet Take 10 mg by mouth daily.  . cyclobenzaprine (FLEXERIL) 10 MG tablet Take 10 mg by mouth 3 (three) times daily as needed for muscle spasms.  . Multiple Vitamin (MULTIVITAMINS PO) Take 1 tablet by mouth daily.   . naproxen sodium (ANAPROX) 220 MG tablet Take 220 mg by mouth as needed (for pain).   Marland Kitchen rOPINIRole (REQUIP) 1 MG tablet Take 1.5 tablets  by mouth at bedtime.     Allergies:   Escitalopram oxalate, Estrogens, Pseudoephedrine, Imitrex [sumatriptan], Mirapex [pramipexole dihydrochloride], Strattera [atomoxetine hcl], Sudafed [pseudoephedrine hcl], and Bupropion hcl   Social History   Socioeconomic History  . Marital status: Divorced    Spouse name: Not on file  . Number of children: Not on file  . Years of education: Not on file  . Highest education level: Not on file  Occupational History  . Not on file  Tobacco Use  . Smoking status: Former Smoker    Types: Cigarettes    Quit date: 06/25/1990    Years since quitting: 29.2  . Smokeless tobacco: Never Used  Substance and Sexual Activity  . Alcohol use: Yes    Alcohol/week: 7.0 - 9.0 standard drinks    Types: 7 - 9 Glasses of wine per week  . Drug use: No  . Sexual activity: Yes  Other Topics Concern  . Not on file  Social History Narrative  . Not on file   Social Determinants of Health   Financial Resource Strain:   . Difficulty of Paying Living Expenses:   Food Insecurity:   . Worried About Charity fundraiser in the Last Year:   . Arboriculturist in the Last Year:   Transportation Needs:   .  Lack of Transportation (Medical):   Marland Kitchen Lack of Transportation (Non-Medical):   Physical Activity:   . Days of Exercise per Week:   . Minutes of Exercise per Session:   Stress:   . Feeling of Stress :   Social Connections:   . Frequency of Communication with Friends and Family:   . Frequency of Social Gatherings with Friends and Family:   . Attends Religious Services:   . Active Member of Clubs or Organizations:   . Attends Banker Meetings:   Marland Kitchen Marital Status:      Family History: The patient's family history includes ADD / ADHD in her daughter and son; Allergic rhinitis in her daughter; Anemia in her sister; Asthma in her mother; Diabetes in her mother; Emphysema in her mother; Heart attack in her maternal uncle and mother; Heart disease in her  mother; Hypertension in her mother; Other in her daughter.  ROS:   Please see the history of present illness.     All other systems reviewed and are negative.  EKGs/Labs/Other Studies Reviewed:    The following studies were reviewed today:  ECHO 07/30/16: - Left ventricle: The cavity size was normal. Systolic function was normal. The estimated ejection fraction was in the range of 55% to 60%. Wall motion was normal; there were no regional wall motion abnormalities. Left ventricular diastolic function parameters were normal.  Holter 07/20/16:  Rare PVC (3) and PAC's (27)  Brief atrial tachycardia (5 beats)  No atrial fibrillation  No ventricular tachycardia  Reassuring monitor.  EKG:  EKG is  ordered today.  The ekg ordered today demonstrates sinus rhythm 82 no other abnormalities  Recent Labs: No results found for requested labs within last 8760 hours.  Recent Lipid Panel No results found for: CHOL, TRIG, HDL, CHOLHDL, VLDL, LDLCALC, LDLDIRECT  Physical Exam:    VS:  BP 132/72   Pulse 82   Ht 5\' 5"  (1.651 m)   Wt 190 lb (86.2 kg)   SpO2 97%   BMI 31.62 kg/m     Wt Readings from Last 3 Encounters:  09/30/19 190 lb (86.2 kg)  09/16/17 189 lb 6.4 oz (85.9 kg)  08/15/17 190 lb 8 oz (86.4 kg)     GEN:  Well nourished, well developed in no acute distress HEENT: Normal NECK: No JVD; No carotid bruits LYMPHATICS: No lymphadenopathy CARDIAC: RRR, no murmurs, rubs, gallops RESPIRATORY:  Clear to auscultation without rales, wheezing or rhonchi  ABDOMEN: Soft, non-tender, non-distended MUSCULOSKELETAL:  No edema; No deformity  SKIN: Warm and dry NEUROLOGIC:  Alert and oriented x 3 PSYCHIATRIC:  Normal affect   ASSESSMENT:    1. Palpitations   2. Essential hypertension   3. Other chest pain    PLAN:    In order of problems listed above:  Palpitations -Previously been diagnosed with PVCs -In the past and increase the Bystolic to help with both  blood pressure and palpitations.  Now she is taking atenolol, cost.  Doing well with this.  Still occasionally will feel these rapid heart rates in the night. -EF was reassuring in 2018. -Talked her about Adderall  Essential hypertension -Pressure one-point increase.  Bystolic seemed to help.  Chest pain 2019 CT scan: IMPRESSION: 1. Coronary calcium score of 0. This was 0 percentile for age and sex matched control.  2. Normal coronary origin with right dominance.  3. No evidence of CAD.  Family history of CAD -Mother died 20 from heart failure.  Reassurance currently with  her CT scan.  Continue with movement, exercise.  I do not feel strongly that she requires a low-dose aspirin.   Medication Adjustments/Labs and Tests Ordered: Current medicines are reviewed at length with the patient today.  Concerns regarding medicines are outlined above.  Orders Placed This Encounter  Procedures  . EKG 12-Lead   No orders of the defined types were placed in this encounter.   Patient Instructions  Medication Instructions:  The current medical regimen is effective;  continue present plan and medications.  *If you need a refill on your cardiac medications before your next appointment, please call your pharmacy*  Follow-Up: At [facility], you and your health needs are our priority.  As part of our continuing mission to provide you with exceptional heart care, we have created designated Provider Care Teams.  These Care Teams include your primary Cardiologist (physician) and Advanced Practice Providers (APPs -  Physician Assistants and Nurse Practitioners) who all work together to provide you with the care you need, when you need it.  We recommend signing up for the patient portal called "MyChart".  Sign up information is provided on this After Visit Summary.  MyChart is used to connect with patients for Virtual Visits (Telemedicine).  Patients are able to view lab/test results, encounter  notes, upcoming appointments, etc.  Non-urgent messages can be sent to your provider as well.   To learn more about what you can do with MyChart, go to [email or web address].    Your next appointment:   12 month(s)  The format for your next appointment:   In Person  Provider:   Donato Schultz, MD   Thank you for choosing [facility]!!        Signed, Donato Schultz, MD  09/30/2019 2:03 PM    [facility]

## 2019-10-14 ENCOUNTER — Ambulatory Visit: Payer: 59 | Attending: Internal Medicine

## 2019-10-14 DIAGNOSIS — Z23 Encounter for immunization: Secondary | ICD-10-CM

## 2019-10-14 NOTE — Progress Notes (Signed)
   Covid-19 Vaccination Clinic  Name:  [name]    MRN: [id number] DOB: 1970-08-23  10/14/2019  [name] was observed post Covid-19 immunization for 15 minutes without incident. She was provided with Vaccine Information Sheet and instruction to access the V-Safe system.   [name] was instructed to call 911 with any severe reactions post vaccine: Marland Kitchen Difficulty breathing  . Swelling of face and throat  . A fast heartbeat  . A bad rash all over body  . Dizziness and weakness   Immunizations Administered    Name Date Dose VIS Date Route   Pfizer COVID-19 Vaccine 10/14/2019  3:28 PM 0.3 mL 06/19/2019 Intramuscular   Manufacturer: [employer], [employer]   Lot: [id number]   NDC: [address]-9

## 2019-11-27 ENCOUNTER — Other Ambulatory Visit: Payer: Self-pay | Admitting: Cardiology

## 2019-12-08 ENCOUNTER — Other Ambulatory Visit: Payer: Self-pay | Admitting: Family Medicine

## 2019-12-08 ENCOUNTER — Other Ambulatory Visit: Payer: Self-pay

## 2019-12-08 ENCOUNTER — Ambulatory Visit
Admission: RE | Admit: 2019-12-08 | Discharge: 2019-12-08 | Disposition: A | Discharge status: Home / Self Care | Payer: 59 | Source: Ambulatory Visit | Attending: Family Medicine | Admitting: Family Medicine

## 2019-12-08 DIAGNOSIS — R109 Unspecified abdominal pain: Secondary | ICD-10-CM

## 2020-06-15 IMAGING — CT CT ABD-PELV W/O CM
1 of 2 series · 14 of 32 positions shown, 19 images · non-contrast
Comparison: None.

CLINICAL DATA: Right flank pain since last [REDACTED].

EXAM:
CT ABDOMEN AND PELVIS WITHOUT CONTRAST
TECHNIQUE: Multidetector CT imaging of the abdomen and pelvis was performed
following the standard protocol without IV contrast.

[Series 2: renal standard/full · axial · 0.84mm/px · z∈[-513,-78]mm · 14 of 99 slices shown, 19 images]
[im 6/99  soft-tissue]
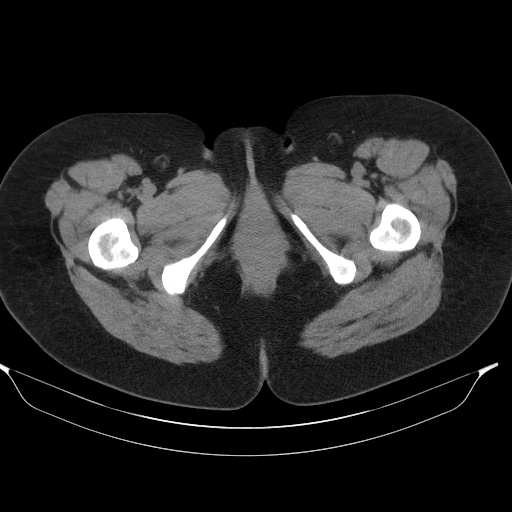
[im 6/99  bone]
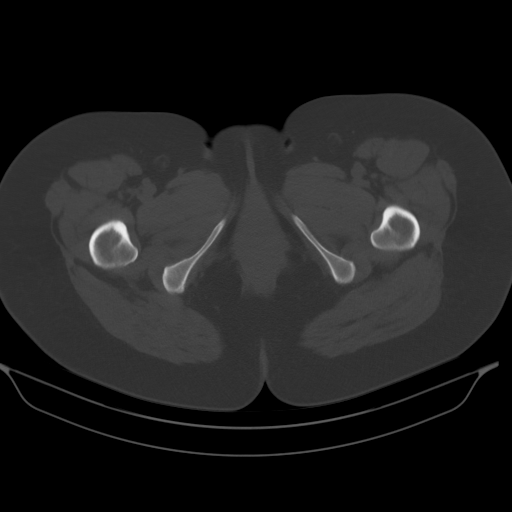
[im 12/99  soft-tissue]
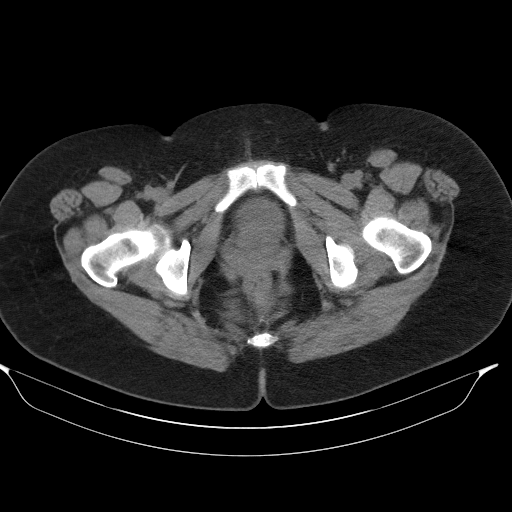
[im 24/99  soft-tissue]
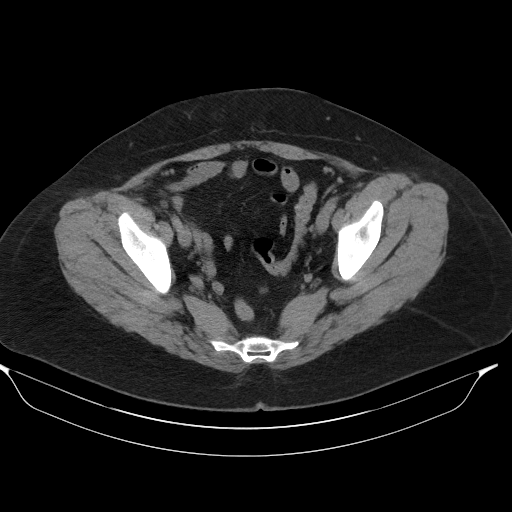
[im 29/99  soft-tissue]
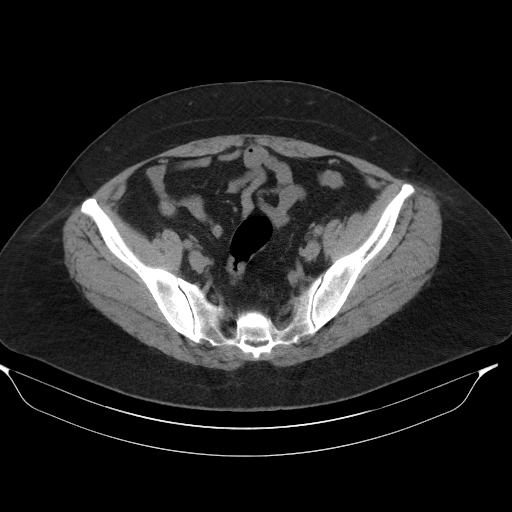
[im 35/99  soft-tissue]
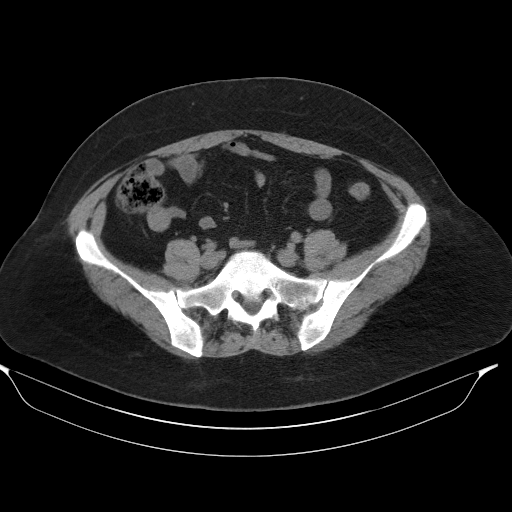
[im 41/99  soft-tissue]
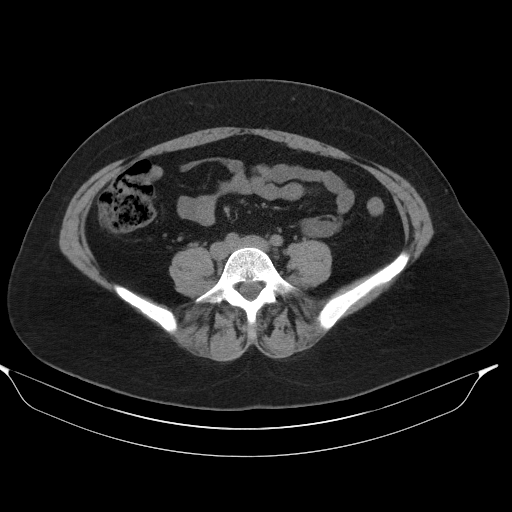
[im 52/99  soft-tissue]
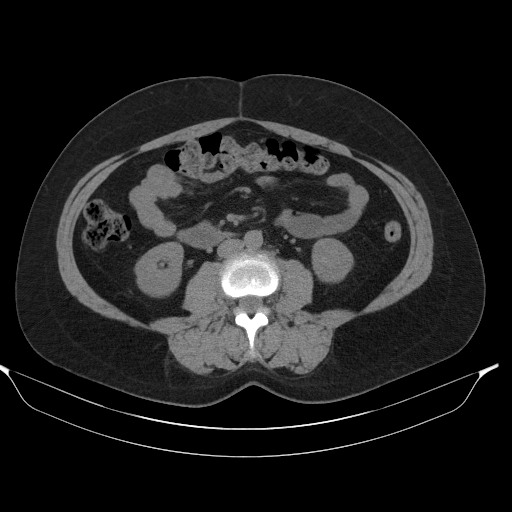
[im 58/99  soft-tissue]
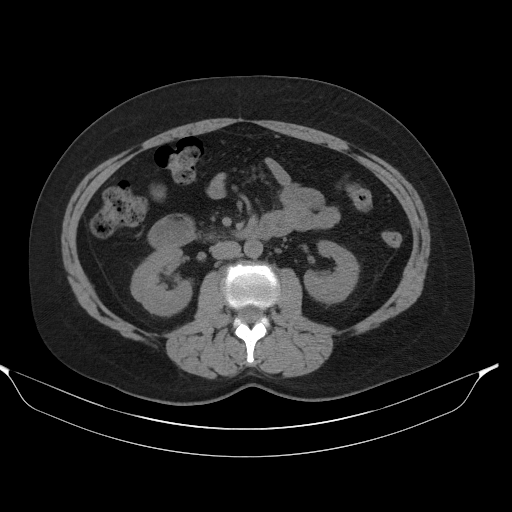
[im 64/99  soft-tissue]
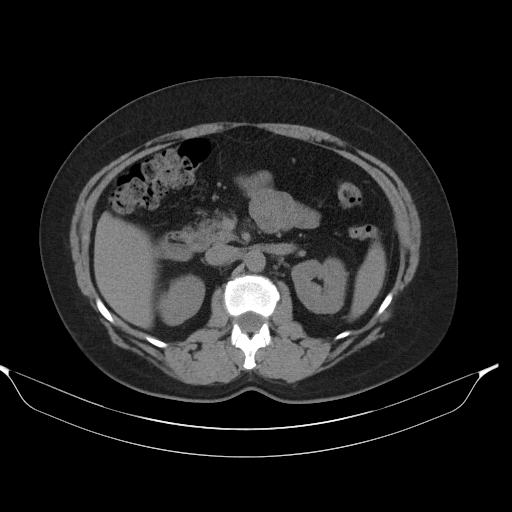
[im 64/99  bone]
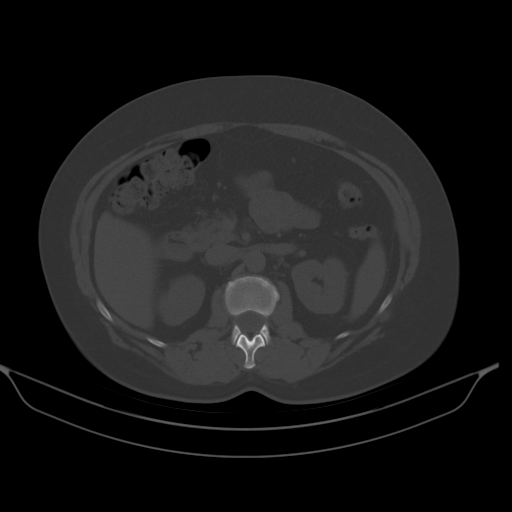
[im 70/99  soft-tissue]
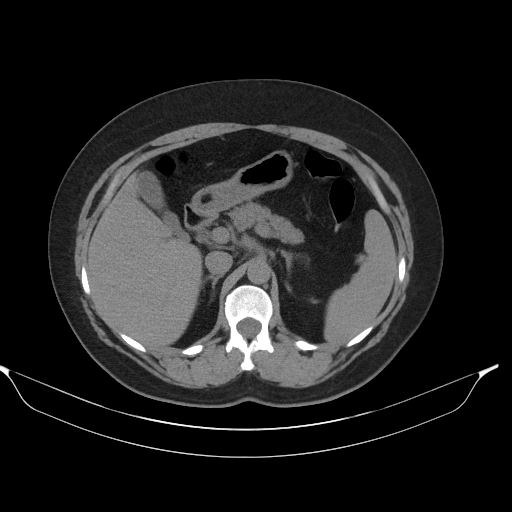
[im 75/99  soft-tissue]
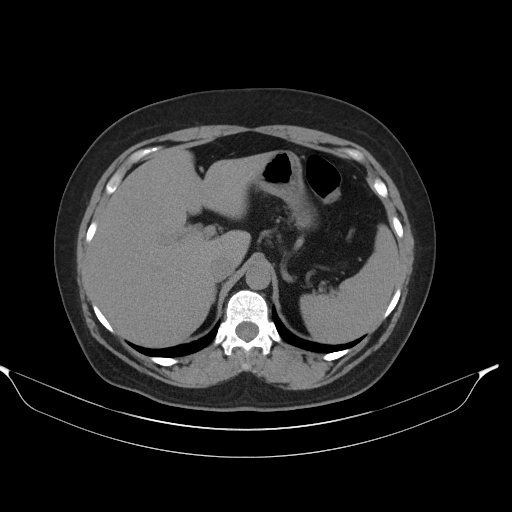
[im 75/99  lung]
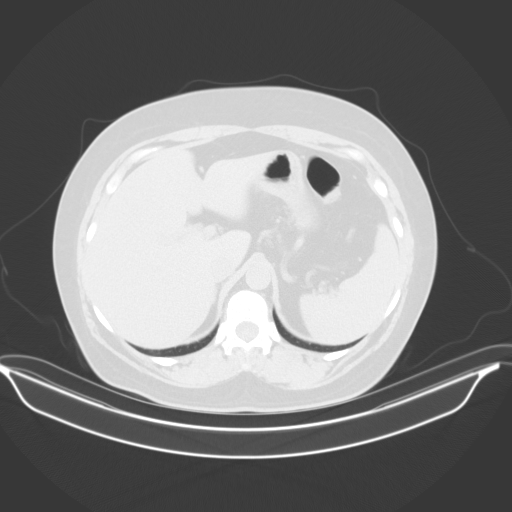
[im 81/99  lung]
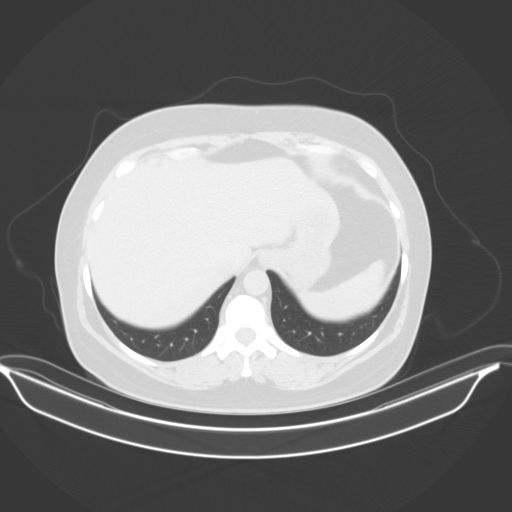
[im 87/99  soft-tissue]
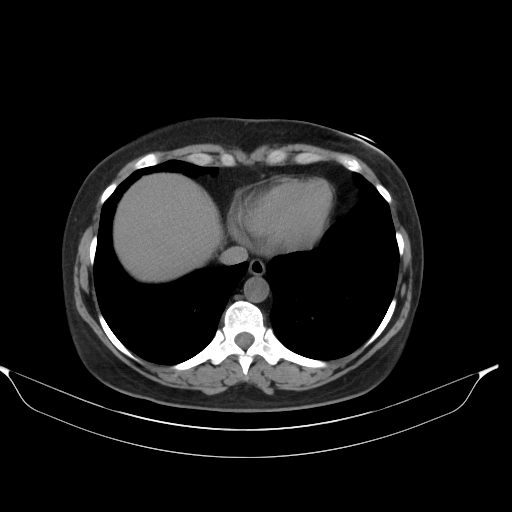
[im 87/99  lung]
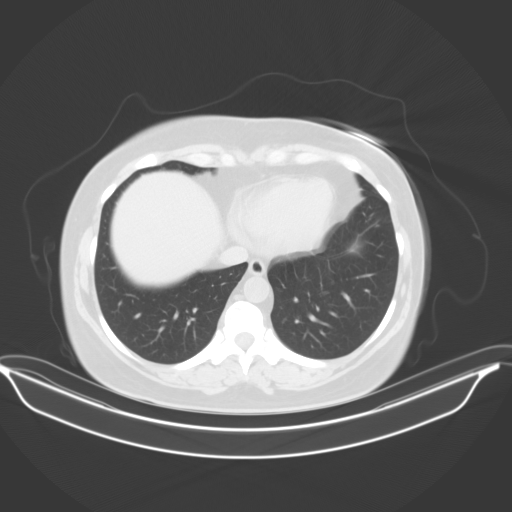
[im 93/99  soft-tissue]
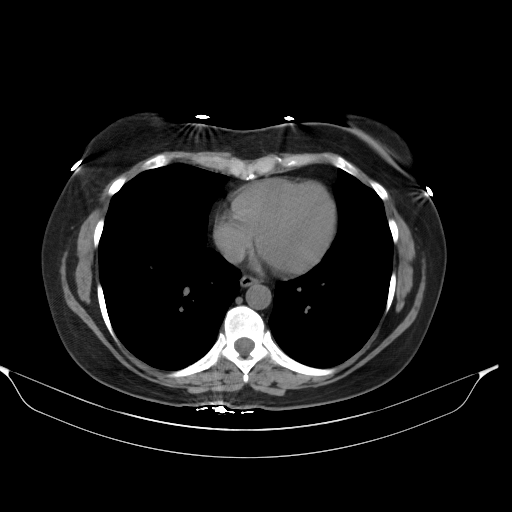
[im 93/99  lung]
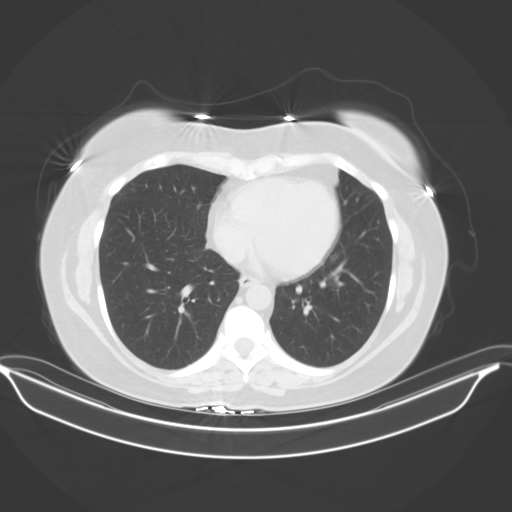

[14 of 32 positions shown; findings below may reference images not displayed]

FINDINGS: Lower chest: The lung bases are clear of acute process. No pleural
effusion or pulmonary lesions. The heart is normal in size. No
pericardial effusion. The distal esophagus and aorta are
unremarkable.

Hepatobiliary: No hepatic lesions or intrahepatic biliary
dilatation. The gallbladder is unremarkable. No common bile duct
dilatation.

Pancreas: No mass, inflammation or ductal dilatation.

Spleen: Within normal limits in size.  No focal lesions.

Adrenals/Urinary Tract: The renal glands and kidneys are
unremarkable. No renal, ureteral or bladder calculi or mass is
identified without contrast.

Stomach/Bowel: The stomach, duodenum, small bowel and colon are
grossly normal without oral contrast. No inflammatory changes, mass
lesions or obstructive findings.

Vascular/Lymphatic: The aorta is normal in caliber. No
atheroscerlotic calcifications. No mesenteric of retroperitoneal
mass or adenopathy. Small scattered lymph nodes are noted.

Reproductive: The uterus and ovaries are unremarkable.

Other: No pelvic mass or adenopathy. No free pelvic fluid
collections. No inguinal mass or adenopathy. No abdominal wall
hernia or subcutaneous lesions.

Musculoskeletal: No significant bony findings.
IMPRESSION: 1. No acute abdominal/pelvic findings, mass lesions or adenopathy.
2. No renal, ureteral or bladder calculi or mass is identified
without contrast.

## 2020-08-01 ENCOUNTER — Telehealth: Payer: Self-pay | Admitting: Cardiology

## 2020-08-01 MED ORDER — NEBIVOLOL HCL 10 MG PO TABS: 10.0000 mg | ORAL_TABLET | Freq: Every day | ORAL | 3 refills | Status: DC

## 2020-08-01 NOTE — Telephone Encounter (Signed)
Rerouting to refills. °

## 2020-08-01 NOTE — Telephone Encounter (Signed)
I would be fine with her going back to the Bystolic and stopping the atenolol. Bystolic 10 mg once a day would be fine.  I believe that is what she was taking previously. Donato Schultz, MD

## 2020-08-01 NOTE — Telephone Encounter (Signed)
Spoke with patient who reports having increased palpitations/fluttering in her chest recently, so much so they were waking her from her sleep.    She had some Bystolic left over from before and changed to it and that has helped.  She is out of the bystolic now and is requesting to be changed from Atenolol.  Advised I will review this information with Dr Anne Fu and send in the RX once he has approved of the change.  She is aware she will be called back once this has occurred.   She is scheduled for a f/u appt with Dr Anne Fu.

## 2020-08-01 NOTE — Telephone Encounter (Signed)
Pt calling stating that she would like for Dr. Anne Fu to prescribe her bystolic again, because Atenolol is not working for her. Pt would like a call back from Dr. Anne Fu nurse concerning this matter. Please address. Thanks

## 2020-08-01 NOTE — Telephone Encounter (Signed)
*  STAT* If patient is at the pharmacy, call can be transferred to refill team.   1. Which medications need to be refilled? (please list name of each medication and dose if known) Bystolic 10mg   2. Which pharmacy/location (including street and city if local pharmacy) is medication to be sent to? [facility] [place] [place] - [address]  3. Do they need a 30 day or 90 day supply? 90

## 2020-08-01 NOTE — Telephone Encounter (Signed)
Pt aware to d/c Atenolol and start Bystolic 10 mg a day.  [facility] [place] as requested.

## 2020-09-01 ENCOUNTER — Other Ambulatory Visit: Payer: Self-pay

## 2020-09-01 ENCOUNTER — Encounter: Payer: Self-pay | Admitting: Cardiology

## 2020-09-01 ENCOUNTER — Ambulatory Visit: Payer: 59 | Admitting: Cardiology

## 2020-09-01 VITALS — BP 112/70 | HR 59 | Ht 65.0 in | Wt 194.0 lb

## 2020-09-01 DIAGNOSIS — R002 Palpitations: Secondary | ICD-10-CM | POA: Diagnosis not present

## 2020-09-01 DIAGNOSIS — I10 Essential (primary) hypertension: Secondary | ICD-10-CM | POA: Diagnosis not present

## 2020-09-01 MED ORDER — NEBIVOLOL HCL 10 MG PO TABS: 10.0000 mg | ORAL_TABLET | Freq: Every day | ORAL | 3 refills | Status: DC

## 2020-09-01 MED ORDER — NEBIVOLOL HCL 10 MG PO TABS: 10.0000 mg | ORAL_TABLET | Freq: Every day | ORAL | 0 refills | Status: DC

## 2020-09-01 NOTE — Patient Instructions (Signed)
Medication Instructions:  Your physician recommends that you continue on your current medications as directed. Please refer to the Current Medication list given to you today. *If you need a refill on your cardiac medications before your next appointment, please call your pharmacy*   Lab Work: none If you have labs (blood work) drawn today and your tests are completely normal, you will receive your results only by: Marland Kitchen MyChart Message (if you have MyChart) OR . A paper copy in the mail If you have any lab test that is abnormal or we need to change your treatment, we will call you to review the results.   Testing/Procedures: none   Follow-Up: At [facility], you and your health needs are our priority.  As part of our continuing mission to provide you with exceptional heart care, we have created designated Provider Care Teams.  These Care Teams include your primary Cardiologist (physician) and Advanced Practice Providers (APPs -  Physician Assistants and Nurse Practitioners) who all work together to provide you with the care you need, when you need it.  We recommend signing up for the patient portal called "MyChart".  Sign up information is provided on this After Visit Summary.  MyChart is used to connect with patients for Virtual Visits (Telemedicine).  Patients are able to view lab/test results, encounter notes, upcoming appointments, etc.  Non-urgent messages can be sent to your provider as well.   To learn more about what you can do with MyChart, go to [email or web address].    Your next appointment:   12 month(s)  The format for your next appointment:   In Person  Provider:   You may see Donato Schultz, MD or one of the following Advanced Practice Providers on your designated Care Team:    Georgie Chard, NP

## 2020-09-01 NOTE — Progress Notes (Signed)
Cardiology Office Note:    Date:  09/01/2020   ID:  [name], DOB 1970/07/28, MRN [id number]  PCP:  [name]   [facility]  Cardiologist:  Donato Schultz, Ramos  Advanced Practice Provider:  No care team member to display Electrophysiologist:  None       Referring Ramos: [name]    History of Present Illness:    [name] is a 50 y.o. female here for follow-up of chest pain. Thankfully coronary calcium was 0. No evidence of coronary artery disease on CT scan in 2019. Palpitations have been previously treated with Bystolic, then atenolol, back on Bystolic. Overall doing quite well. Adderall being utilized for ADHD.  No fevers chills nausea vomiting syncope bleeding.  Past Medical History:  Diagnosis Date  . ADD (attention deficit disorder)   . ALLERGIC RHINITIS 10/10/2007   Qualifier: Diagnosis of  By: [name], [name]    . Anxiety   . Chest pain   . Hypertension   . Intrinsic asthma, unspecified 10/10/2007   Qualifier: Diagnosis of  By: [name], [name]    . Migraines   . Palpitations   . Peptic ulcer   . VT (ventricular tachycardia) ([facility])     Past Surgical History:  Procedure Laterality Date  . CESAREAN SECTION    . TUBAL LIGATION      Current Medications: Current Meds  Medication Sig  . albuterol (PROVENTIL HFA;VENTOLIN HFA) 108 (90 Base) MCG/ACT inhaler Inhale 1 puff into the lungs as needed for wheezing or shortness of breath.  . amphetamine-dextroamphetamine (ADDERALL) 30 MG tablet Take 30 mg by mouth 2 (two) times daily.   . cetirizine (ZYRTEC) 10 MG tablet Take 10 mg by mouth daily.  . cyclobenzaprine (FLEXERIL) 10 MG tablet Take 10 mg by mouth 3 (three) times daily as needed for muscle spasms.  . Multiple Vitamin (MULTIVITAMINS PO) Take 1 tablet by mouth daily.   . naproxen sodium (ANAPROX) 220 MG tablet Take 220 mg by mouth as needed (for pain).   Marland Kitchen rOPINIRole (REQUIP) 1 MG tablet Take 1.5 tablets by mouth at bedtime.   . [DISCONTINUED] nebivolol (BYSTOLIC) 10 MG tablet Take 1 tablet (10 mg total) by mouth daily.     Allergies:   Escitalopram oxalate, Estrogens, Pseudoephedrine, Imitrex [sumatriptan], Mirapex [pramipexole dihydrochloride], Strattera [atomoxetine hcl], Sudafed [pseudoephedrine hcl], and Bupropion hcl   Social History   Socioeconomic History  . Marital status: Divorced    Spouse name: Not on file  . Number of children: Not on file  . Years of education: Not on file  . Highest education level: Not on file  Occupational History  . Not on file  Tobacco Use  . Smoking status: Former Smoker    Types: Cigarettes    Quit date: 06/25/1990    Years since quitting: 30.2  . Smokeless tobacco: Never Used  Vaping Use  . Vaping Use: Never used  Substance and Sexual Activity  . Alcohol use: Yes    Alcohol/week: 7.0 - 9.0 standard drinks    Types: 7 - 9 Glasses of wine per week  . Drug use: No  . Sexual activity: Yes  Other Topics Concern  . Not on file  Social History Narrative  . Not on file   Social Determinants of Health   Financial Resource Strain: Not on file  Food Insecurity: Not on file  Transportation Needs: Not on file  Physical Activity: Not on file  Stress: Not  on file  Social Connections: Not on file     Family History: The patient's family history includes ADD / ADHD in her daughter and son; Allergic rhinitis in her daughter; Anemia in her sister; Asthma in her mother; Diabetes in her mother; Emphysema in her mother; Heart attack in her maternal uncle and mother; Heart disease in her mother; Hypertension in her mother; Other in her daughter.  ROS:   Please see the history of present illness.     All other systems reviewed and are negative.  EKGs/Labs/Other Studies Reviewed:    The following studies were reviewed today:  Holter 2018   Rare PVC (3) and PAC's (27)  Brief atrial tachycardia (5 beats)  No atrial fibrillation  No ventricular  tachycardia  Reassuring monitor.   ECHO 2018 - Left ventricle: The cavity size was normal. Systolic function was  normal. The estimated ejection fraction was in the range of 55%  to 60%. Wall motion was normal; there were no regional wall  motion abnormalities. Left ventricular diastolic function  parameters were normal.   EKG:  EKG is  ordered today.  The ekg ordered today demonstrates SB 59  Recent Labs: No results found for requested labs within last 8760 hours.  Recent Lipid Panel No results found for: CHOL, TRIG, HDL, CHOLHDL, VLDL, LDLCALC, LDLDIRECT   Risk Assessment/Calculations:      Physical Exam:    VS:  BP 112/70 (BP Location: Left Arm, Patient Position: Sitting, Cuff Size: Normal)   Pulse (!) 59   Ht 5\' 5"  (1.651 m)   Wt 194 lb (88 kg)   SpO2 97%   BMI 32.28 kg/m     Wt Readings from Last 3 Encounters:  09/01/20 194 lb (88 kg)  09/30/19 190 lb (86.2 kg)  09/16/17 189 lb 6.4 oz (85.9 kg)     GEN:  Well nourished, well developed in no acute distress HEENT: Normal NECK: No JVD; No carotid bruits LYMPHATICS: No lymphadenopathy CARDIAC: RRR, no murmurs, rubs, gallops RESPIRATORY:  Clear to auscultation without rales, wheezing or rhonchi  ABDOMEN: Soft, non-tender, non-distended MUSCULOSKELETAL:  No edema; No deformity  SKIN: Warm and dry NEUROLOGIC:  Alert and oriented x 3 PSYCHIATRIC:  Normal affect   ASSESSMENT:    1. Palpitations   2. Essential hypertension    PLAN:    In order of problems listed above:  Palpitations -Prior PVCs, Bystolic fairly effective. She had to switch over to atenolol because of cost but now on Bystolic. Seems to be doing quite well with this. Occasional rapid heart rates at night. -Prior EF reassuring in 2018. Also taking Adderall -Still feeling some palpitations in the middle of the night.  Offered her the possibility of utilizing flecainide for instance.  For now, we will continue with current treatment  strategy.  Essential hypertension -Seems to doing fairly well with this.  Family history of CAD -Her mother died at age 52 from heart failure. Thankfully her CT scan currently does not show any evidence of coronary calcification. We are continuing with exercise.    Medication Adjustments/Labs and Tests Ordered: Current medicines are reviewed at length with the patient today.  Concerns regarding medicines are outlined above.  Orders Placed This Encounter  Procedures  . EKG 12-Lead   No orders of the defined types were placed in this encounter.   Patient Instructions  Medication Instructions:  Your physician recommends that you continue on your current medications as directed. Please refer to the Current Medication list  given to you today. *If you need a refill on your cardiac medications before your next appointment, please call your pharmacy*   Lab Work: none If you have labs (blood work) drawn today and your tests are completely normal, you will receive your results only by: Marland Kitchen MyChart Message (if you have MyChart) OR . A paper copy in the mail If you have any lab test that is abnormal or we need to change your treatment, we will call you to review the results.   Testing/Procedures: none   Follow-Up: At [facility], you and your health needs are our priority.  As part of our continuing mission to provide you with exceptional heart care, we have created designated Provider Care Teams.  These Care Teams include your primary Cardiologist (physician) and Advanced Practice Providers (APPs -  Physician Assistants and Nurse Practitioners) who all work together to provide you with the care you need, when you need it.  We recommend signing up for the patient portal called "MyChart".  Sign up information is provided on this After Visit Summary.  MyChart is used to connect with patients for Virtual Visits (Telemedicine).  Patients are able to view lab/test results, encounter notes,  upcoming appointments, etc.  Non-urgent messages can be sent to your provider as well.   To learn more about what you can do with MyChart, go to [email or web address].    Your next appointment:   12 month(s)  The format for your next appointment:   In Person  Provider:   You may see Donato Schultz, Ramos or one of the following Advanced Practice Providers on your designated Care Team:    Georgie Chard, NP        Signed, Donato Schultz, Ramos  09/01/2020 9:01 AM    [facility]

## 2021-02-25 ENCOUNTER — Emergency Department (HOSPITAL_BASED_OUTPATIENT_CLINIC_OR_DEPARTMENT_OTHER)
Admission: EM | Admit: 2021-02-25 | Discharge: 2021-02-26 | Disposition: A | Discharge status: Home / Self Care | Payer: 59 | Attending: Emergency Medicine | Admitting: Emergency Medicine

## 2021-02-25 ENCOUNTER — Other Ambulatory Visit: Payer: Self-pay

## 2021-02-25 ENCOUNTER — Encounter (HOSPITAL_BASED_OUTPATIENT_CLINIC_OR_DEPARTMENT_OTHER): Payer: Self-pay

## 2021-02-25 DIAGNOSIS — Z79899 Other long term (current) drug therapy: Secondary | ICD-10-CM | POA: Insufficient documentation

## 2021-02-25 DIAGNOSIS — I10 Essential (primary) hypertension: Secondary | ICD-10-CM | POA: Insufficient documentation

## 2021-02-25 DIAGNOSIS — J45909 Unspecified asthma, uncomplicated: Secondary | ICD-10-CM | POA: Insufficient documentation

## 2021-02-25 DIAGNOSIS — R000 Tachycardia, unspecified: Secondary | ICD-10-CM | POA: Diagnosis not present

## 2021-02-25 DIAGNOSIS — R002 Palpitations: Secondary | ICD-10-CM | POA: Insufficient documentation

## 2021-02-25 DIAGNOSIS — Z87891 Personal history of nicotine dependence: Secondary | ICD-10-CM | POA: Insufficient documentation

## 2021-02-25 LAB — CBC
HCT: 43.5 % (ref 36.0–46.0)
Hemoglobin: 14.9 g/dL (ref 12.0–15.0)
MCH: 31.1 pg (ref 26.0–34.0)
MCHC: 34.3 g/dL (ref 30.0–36.0)
MCV: 90.8 fL (ref 80.0–100.0)
Platelets: 264 10*3/uL (ref 150–400)
RBC: 4.79 MIL/uL (ref 3.87–5.11)
RDW: 12 % (ref 11.5–15.5)
WBC: 8.1 10*3/uL (ref 4.0–10.5)
nRBC: 0 % (ref 0.0–0.2)

## 2021-02-25 LAB — BASIC METABOLIC PANEL
Anion gap: 13 (ref 5–15)
BUN: 18 mg/dL (ref 6–20)
CO2: 23 mmol/L (ref 22–32)
Calcium: 9.3 mg/dL (ref 8.9–10.3)
Chloride: 106 mmol/L (ref 98–111)
Creatinine, Ser: 0.72 mg/dL (ref 0.44–1.00)
GFR, Estimated: >60 mL/min (ref >60)
Glucose, Bld: 108 mg/dL — ABNORMAL HIGH (ref 70–99)
Potassium: 3.4 mmol/L — ABNORMAL LOW (ref 3.5–5.1)
Sodium: 142 mmol/L (ref 135–145)

## 2021-02-25 LAB — TROPONIN I (HIGH SENSITIVITY): Troponin I (High Sensitivity): 4 ng/L (ref <18)

## 2021-02-25 MED ORDER — LACTATED RINGERS IV BOLUS
1000.0000 mL | Freq: Once | INTRAVENOUS | Status: AC
  Administered 2021-02-26: 1000 mL via INTRAVENOUS

## 2021-02-25 NOTE — Discharge Instructions (Signed)
You are seen in the ER for palpitations.  The EKG did not reveal any arrhythmia.  It appears that you are having extra beats according to your iPhone monitor.  That being said, the next best step is likely to get Holter monitor to get a better idea of what underlying arrhythmia you have so that we can manage it optimally.  Please contact Dr. Anne Fu clinic on Monday.  A message has been sent to his team as well about this visit.

## 2021-02-25 NOTE — ED Provider Notes (Signed)
[facility] EMERGENCY DEPT Provider Note   CSN: [id number] Arrival date & time: 02/25/21  2102     History Chief Complaint  Patient presents with   Palpitations    [name] is a 50 y.o. female.  HPI    50 year old female comes in with chief complaint of palpitations.  Patient has history of hypertension, atrial arrhythmia on Bystolic. She reports that she had arrhythmia work-up done 2 or 3 years ago.  Since then she has been having intermittent episodes of arrhythmia.  Typically her symptoms are nighttime symptoms, often waking her up.  Normally she gets nocturnal symptoms that wake her up at least 1 or 2 times a week.  Today she had daytime symptoms.  She describes her symptoms as palpitations with fluttering of her heart.  She was monitoring her apple watch and her heart rate was jumping between 70 and 170 and the report was reading as A. fib.  Patient denies any new medications or any cardiac history besides the prior atrial arrhythmia.   She has been symptom-free while in the ER.  She denies any recent infections, heavy smoking, drinking, amphetamines or NEW stimulant use.  She is on ADHD medications, but that his not new.  Past Medical History:  Diagnosis Date   ADD (attention deficit disorder)    ALLERGIC RHINITIS 10/10/2007   Qualifier: Diagnosis of  By: Craige Cotta MD, Vineet     Anxiety    Chest pain    Hypertension    Intrinsic asthma, unspecified 10/10/2007   Qualifier: Diagnosis of  By: Craige Cotta MD, Vineet     Migraines    Palpitations    Peptic ulcer    VT (ventricular tachycardia) [facility])     Patient Active Problem List   Diagnosis Date Noted   Chest pain 06/25/2016   ALLERGIC RHINITIS 10/10/2007   INTRINSIC ASTHMA, UNSPECIFIED 10/10/2007    Past Surgical History:  Procedure Laterality Date   CESAREAN SECTION     TUBAL LIGATION       OB History   No obstetric history on file.     Family History  Problem Relation Age of Onset   Hypertension  Mother    Heart attack Mother    Heart disease Mother    Diabetes Mother    Emphysema Mother    Asthma Mother    Anemia Sister    Heart attack Maternal Uncle    Allergic rhinitis Daughter    ADD / ADHD Daughter    Other Daughter        keratosis pilaris, chest pain,nevus-neoplastic   ADD / ADHD Son     Social History   Tobacco Use   Smoking status: Former    Types: Cigarettes    Quit date: 06/25/1990    Years since quitting: 30.6   Smokeless tobacco: Never  Vaping Use   Vaping Use: Never used  Substance Use Topics   Alcohol use: Yes    Alcohol/week: 7.0 - 9.0 standard drinks    Types: 7 - 9 Glasses of wine per week   Drug use: No    Home Medications Prior to Admission medications   Medication Sig Start Date End Date Taking? Authorizing Provider  albuterol (PROVENTIL HFA;VENTOLIN HFA) 108 (90 Base) MCG/ACT inhaler Inhale 1 puff into the lungs as needed for wheezing or shortness of breath.    [provider]  amphetamine-dextroamphetamine (ADDERALL) 30 MG tablet Take 30 mg by mouth 2 (two) times daily.     [provider]  cetirizine (ZYRTEC) 10 MG tablet Take 10 mg by mouth daily.    [provider]  cyclobenzaprine (FLEXERIL) 10 MG tablet Take 10 mg by mouth 3 (three) times daily as needed for muscle spasms.    [provider]  Multiple Vitamin (MULTIVITAMINS PO) Take 1 tablet by mouth daily.     [provider]  naproxen sodium (ANAPROX) 220 MG tablet Take 220 mg by mouth as needed (for pain).     [provider]  nebivolol (BYSTOLIC) 10 MG tablet Take 1 tablet (10 mg total) by mouth daily. 09/01/20   Jake Bathe, MD  rOPINIRole (REQUIP) 1 MG tablet Take 1.5 tablets by mouth at bedtime. 06/27/16   [provider]    Allergies    Escitalopram oxalate, Estrogens, Pseudoephedrine, Imitrex [sumatriptan], Mirapex [pramipexole dihydrochloride], Strattera [atomoxetine hcl], Sudafed [pseudoephedrine hcl], and  Bupropion hcl  Review of Systems   Review of Systems  Constitutional:  Positive for activity change.  Cardiovascular:  Positive for palpitations.  All other systems reviewed and are negative.  Physical Exam Updated Vital Signs BP (!) 147/93   Pulse 98   Temp 98.5 F (36.9 C) (Oral)   Resp 18   Ht 5\' 5"  (1.651 m)   Wt 90.7 kg   SpO2 98%   BMI 33.28 kg/m   Physical Exam Vitals and nursing note reviewed.  Constitutional:      Appearance: She is well-developed.  HENT:     Head: Atraumatic.  Cardiovascular:     Rate and Rhythm: Tachycardia present.  Pulmonary:     Effort: Pulmonary effort is normal.  Musculoskeletal:     Cervical back: Normal range of motion and neck supple.  Skin:    General: Skin is warm and dry.  Neurological:     Mental Status: She is alert and oriented to person, place, and time.    ED Results / Procedures / Treatments   Labs (all labs ordered are listed, but only abnormal results are displayed) Labs Reviewed  BASIC METABOLIC PANEL - Abnormal; Notable for the following components:      Result Value   Potassium 3.4 (*)    Glucose, Bld 108 (*)    All other components within normal limits  CBC  TROPONIN I (HIGH SENSITIVITY)  TROPONIN I (HIGH SENSITIVITY)    EKG None ED ECG REPORT   Date: 02/25/2021  Rate: 102  Rhythm: sinus tachycardia  QRS Axis: normal  Intervals: normal  ST/T Wave abnormalities: nonspecific ST changes and nonspecific T wave changes  Conduction Disutrbances:none  Narrative Interpretation:   Old EKG Reviewed: changes noted  I have personally reviewed the EKG tracing and agree with the computerized printout as noted.    Radiology No results found.  Procedures Procedures   Medications Ordered in ED Medications  lactated ringers bolus 1,000 mL (has no administration in time range)    ED Course  I have reviewed the triage vital signs and the nursing notes.  Pertinent labs & imaging results that were  available during my care of the patient were reviewed by me and considered in my medical decision making (see chart for details).    MDM Rules/Calculators/A&P                           50 year old female comes in with chief complaint of palpitations.  It appears that she has had this issue for a long time now.  She  has had work-up for it few years back.  Holter monitor at that time revealed nonsustained VT, atrial arrhythmia.  Since then she has been having nighttime symptoms quite frequently, but today she had prolonged period of palpitation during daytime.  Her iPhone kept on reading A. fib.  I have reviewed the tracing on her phone.  There is clearly irregularly irregular rhythm, but it appears that she is having PACs, and her native rhythm has PE in front of the QRS.   Currently she is in sinus rhythm.  There are some nonspecific EKG changes.  12:00 AM Patient's telemetry monitoring has not revealed any arrhythmias.  She remained in sinus rhythm with heart rate as high as 120.   Message sent to Dr. Anne Fu about this visit, so that patient can get appropriate care.Strict ER return precautions have been discussed, and patient is agreeing with the plan and is comfortable with the workup done and the recommendations from the ER.   Final Clinical Impression(s) / ED Diagnoses Final diagnoses:  Palpitations    Rx / DC Orders ED Discharge Orders     None        Derwood Kaplan, MD 02/26/21 0001

## 2021-02-25 NOTE — ED Triage Notes (Addendum)
Pt is present for "fluttering and palpitations" that have been intermittent throughout the day. Pt wears her apple watch and has watched her HR go from 70's-170's and read 'Afib'. Pt does not have a hx of Afib but does have a hx of palpitations. Denies SOB/dizziness/lightheadedness. Denies chest pain other than the "fluttering". Pt states the fluttering is normally throughout the night but does not usually have it during the day and at rest.

## 2021-02-26 LAB — TROPONIN I (HIGH SENSITIVITY): Troponin I (High Sensitivity): 9 ng/L (ref <18)

## 2021-02-28 ENCOUNTER — Ambulatory Visit (INDEPENDENT_AMBULATORY_CARE_PROVIDER_SITE_OTHER): Payer: 59

## 2021-02-28 ENCOUNTER — Telehealth: Payer: Self-pay | Admitting: Cardiology

## 2021-02-28 DIAGNOSIS — R002 Palpitations: Secondary | ICD-10-CM

## 2021-02-28 NOTE — Progress Notes (Unsigned)
Patient enrolled for [employer] to mail a 14 day ZIO XT monitor to her address on file. 

## 2021-02-28 NOTE — Telephone Encounter (Signed)
Spoke with pt and reviewed recommendations.  Pt agreeable to wearing monitor.  She is flying out 8/31 and returning 9/4.  Advised ok to hold off on putting monitor on until she returns.  Pt appreciative for call.

## 2021-02-28 NOTE — Telephone Encounter (Signed)
Jake Bathe, MD  Julio Sicks, RN Please order [employer] XT  Palpitations  Thanks  Donato Schultz, MD    Order placed for monitor

## 2021-03-13 DIAGNOSIS — R002 Palpitations: Secondary | ICD-10-CM

## 2021-04-21 ENCOUNTER — Telehealth: Payer: Self-pay | Admitting: *Deleted

## 2021-04-21 MED ORDER — DILTIAZEM HCL 30 MG PO TABS: 30.0000 mg | ORAL_TABLET | Freq: Four times a day (QID) | ORAL | 3 refills | Status: AC | PRN

## 2021-04-21 NOTE — Telephone Encounter (Signed)
-----   Message from Jake Bathe, MD sent at 04/21/2021  6:41 AM EDT ----- Start Diltiazem 30mg  PO Q6 hours PRN. , MD

## 2021-04-21 NOTE — Telephone Encounter (Signed)
RX sent electronically to [facility] as requested.

## 2021-07-20 ENCOUNTER — Other Ambulatory Visit: Payer: Self-pay | Admitting: Cardiology

## 2021-08-14 DIAGNOSIS — I10 Essential (primary) hypertension: Secondary | ICD-10-CM | POA: Insufficient documentation

## 2021-08-14 DIAGNOSIS — R002 Palpitations: Secondary | ICD-10-CM | POA: Insufficient documentation

## 2021-08-14 NOTE — Progress Notes (Signed)
Cardiology Office Note:    Date:  08/15/2021   ID:  [name], DOB 06-14-1971, MRN [id number]  PCP:  Sigmund Hazel, MD  [facility] Providers Cardiologist:  Donato Schultz, MD    Referring MD: Sigmund Hazel, MD   Chief Complaint:  Follow-up for palpitations    Patient Profile: Palpitations PVCs beta-blocker Rx (Nebivolol >> Atenolol >> Nebivolol) ?hx of NSVT on prior monitor  CAC score 0; CCTA in 7/19: no CAD  Hypertension  PUD ADD  Prior CV Studies: Cardiac Monitor 04/05/21 NSR, avg HR 85; Rare PVCs, PACs; brief parox ATach (10 beats, avg 132); no AF/pauses/VT Sx's of fluttering assoc w NSR  CCTA 02/11/18 CAC score 0; no CAD  ETT 10/08/17 < 1 mm J point depression w down sloping ST segments in inf-lat leads  Echocardiogram 07/30/16 EF 55-60, no RWMA  Holter 07/20/16 Rare PVCs, PACs; no AF/VT; Brief ATach (5 beats)   History of Present Illness:   [name] is a 51 y.o. female with the above problem list.  She was last seen by Dr. Anne Fu in 2/22.  She was seen in the ED 02/25/21 for palpitations.  Her Apple watch was alerting her that she was in AFib. This was reviewed by the ED physician and felt to show NSR with PACs.  A Zio monitor was ordered and this showed NSR, rare PVCs, PACs.  There were some brief episodes of ATach (10 beats).  Symptoms were reported during NSR.  There were no sustained arrhythmias.  She was started on Diltiazem 30 mg prn.  She returns for f/u.  She is here alone.  Since last seen, she has not had chest pain, shortness of breath, syncope.  She does have rapid palpitations that occur about 1 time per month.  She also notes a rapid increase in her heart rate with any type of activity.  This has been an issue for her for many years.  She feels that it has gotten worse with gaining weight.  She did show me labs obtained by her PCP on her portal on her phone today.  In 06/2021, her TSH was 3 and K+ was 5.        Past Medical History:  Diagnosis Date    ADD (attention deficit disorder)    ALLERGIC RHINITIS 10/10/2007   Qualifier: Diagnosis of  By: Craige Cotta MD, Vineet     Anxiety    Chest pain    Hypertension    Intrinsic asthma, unspecified 10/10/2007   Qualifier: Diagnosis of  By: Craige Cotta MD, Vineet     Migraines    Palpitations    Peptic ulcer    VT (ventricular tachycardia)    Current Medications: Current Meds  Medication Sig   albuterol (PROVENTIL HFA;VENTOLIN HFA) 108 (90 Base) MCG/ACT inhaler Inhale 1 puff into the lungs as needed for wheezing or shortness of breath.   amphetamine-dextroamphetamine (ADDERALL) 30 MG tablet Take 30 mg by mouth 2 (two) times daily.    B Complex CAPS See admin instructions.   cetirizine (ZYRTEC) 10 MG tablet Take 10 mg by mouth daily.   Cholecalciferol (VITAMIN D3 ULTRA STRENGTH) 125 MCG (5000 UT) capsule See admin instructions.   Collagen-Vitamin C-Biotin (COLLAGEN 1500/C PO) Take 1 capsule by mouth daily.   cyclobenzaprine (FLEXERIL) 10 MG tablet Take 10 mg by mouth 3 (three) times daily as needed for muscle spasms.   diltiazem (CARDIZEM) 30 MG tablet Take 1 tablet (30 mg total) by mouth every 6 (six) hours  as needed.   Multiple Vitamin (MULTIVITAMINS PO) Take 1 tablet by mouth daily.    naproxen sodium (ANAPROX) 220 MG tablet Take 220 mg by mouth as needed (for pain).    rOPINIRole (REQUIP) 1 MG tablet Take 1.5 tablets by mouth at bedtime.   SUMAtriptan (IMITREX) 5 MG/ACT nasal spray 1 spray at onset of headache in one nostril may repeat dose after 2 hours as needed   [DISCONTINUED] nebivolol (BYSTOLIC) 10 MG tablet TAKE 1 TABLET BY MOUTH  DAILY    Allergies:   Escitalopram oxalate, Estrogens, Pseudoephedrine, Mirapex [pramipexole dihydrochloride], Strattera [atomoxetine hcl], Sudafed [pseudoephedrine hcl], Sumatriptan, and Bupropion hcl   Social History   Tobacco Use   Smoking status: Former    Types: Cigarettes    Quit date: 06/25/1990    Years since quitting: 31.1   Smokeless tobacco: Never   Vaping Use   Vaping Use: Never used  Substance Use Topics   Alcohol use: Yes    Alcohol/week: 7.0 - 9.0 standard drinks    Types: 7 - 9 Glasses of wine per week   Drug use: No    Family Hx: The patient's family history includes ADD / ADHD in her daughter and son; Allergic rhinitis in her daughter; Anemia in her sister; Asthma in her mother; Diabetes in her mother; Emphysema in her mother; Heart attack in her maternal uncle and mother; Heart disease in her mother; Hypertension in her mother; Other in her daughter.  ROS see HPI  EKGs/Labs/Other Test Reviewed:    EKG:  EKG is  ordered today.  The ekg ordered today demonstrates NSR, HR 74, normal axis, no ST-T wave changes, QTc 386  Recent Labs: 02/25/2021: BUN 18; Creatinine, Ser 0.72; Hemoglobin 14.9; Platelets 264; Potassium 3.4; Sodium 142   Recent Lipid Panel No results for input(s): CHOL, TRIG, HDL, VLDL, LDLCALC, LDLDIRECT in the last 8760 hours.   Risk Assessment/Calculations:         Physical Exam:    VS:  BP 120/70 (BP Location: Right Arm, Patient Position: Sitting, Cuff Size: Normal)    Pulse 74    Ht 5\' 5"  (1.651 m)    Wt 204 lb 12.8 oz (92.9 kg)    SpO2 97%    BMI 34.08 kg/m     Wt Readings from Last 3 Encounters:  08/15/21 204 lb 12.8 oz (92.9 kg)  02/25/21 200 lb (90.7 kg)  09/01/20 194 lb (88 kg)    Constitutional:      Appearance: Healthy appearance. Not in distress.  Neck:     Vascular: JVD normal.  Pulmonary:     Effort: Pulmonary effort is normal.     Breath sounds: No wheezing. No rales.  Cardiovascular:     Normal rate. Regular rhythm. Normal S1. Normal S2.      Murmurs: There is no murmur.  Edema:    Peripheral edema absent.  Abdominal:     Palpations: Abdomen is soft.  Skin:    General: Skin is warm and dry.  Neurological:     Mental Status: Alert and oriented to person, place and time.     Cranial Nerves: Cranial nerves are intact.        ASSESSMENT & PLAN:   Palpitations She did have a  brief episode of atrial tachycardia noted on her monitor.  She does occasionally feel rapid heartbeats.  These are fairly infrequent.  She is mainly bothered by rapid increase in her heart rate with any type of activity.  We  discussed continued use of diltiazem 30 mg as needed and maintenance beta-blocker nebivolol 10 mg daily.  We also discussed potentially increasing her beta-blocker dose to see if we can lessen the amount of rapid palpitations she has as well as blunting her heart rate response some with increased activity.  I did advise her that regular exercise will also help improve her heart rate response to activity.  She plans to start an exercise program as well as to lose weight.  She would like to try the higher dose of nebivolol. Increase nebivolol to 15 mg daily Continue diltiazem 30 mg as needed Follow-up 6 months  Essential hypertension Blood pressure is well controlled.  Increase nebivolol to 15 mg daily as noted for palpitations.  I have advised her that she should contact us if she cannot tolerate this dose due to low blood pressure.           Dispo:  Return in about 6 months (around 02/12/2022) for Routine follow up in 6 months with Dr.Skains. .   Medication Adjustments/Labs and Tests Ordered: Current medicines are reviewed at length with the patient today.  Concerns regarding medicines are outlined above.  Tests Ordered: Orders Placed This Encounter  Procedures   EKG 12-Lead   Medication Changes: Meds ordered this encounter  Medications   nebivolol (BYSTOLIC) 10 MG tablet    Sig: Take 1.5 tablets (15 mg total) by mouth daily.    Dispense:  135 tablet    Refill:  3    Requesting 1 year supply   Signed, Tereso Newcomer, PA-C  08/15/2021 6:01 PM    [facility] [address] [place], [place], [place]  [address] Phone: [phone]; Fax: [phone]

## 2021-08-15 ENCOUNTER — Encounter: Payer: Self-pay | Admitting: Physician Assistant

## 2021-08-15 ENCOUNTER — Ambulatory Visit: Payer: 59 | Admitting: Physician Assistant

## 2021-08-15 ENCOUNTER — Other Ambulatory Visit: Payer: Self-pay

## 2021-08-15 VITALS — BP 120/70 | HR 74 | Ht 65.0 in | Wt 204.8 lb

## 2021-08-15 DIAGNOSIS — I10 Essential (primary) hypertension: Secondary | ICD-10-CM

## 2021-08-15 DIAGNOSIS — R002 Palpitations: Secondary | ICD-10-CM

## 2021-08-15 MED ORDER — NEBIVOLOL HCL 10 MG PO TABS: 15.0000 mg | ORAL_TABLET | Freq: Every day | ORAL | 3 refills | Status: DC

## 2021-08-15 NOTE — Patient Instructions (Signed)
Medication Instructions:   INCREASE  Bystolic one and one half tablet by mouth ( 15 mg) daily.   *If you need a refill on your cardiac medications before your next appointment, please call your pharmacy*   Lab Work:  None ordered.   If you have labs (blood work) drawn today and your tests are completely normal, you will receive your results only by: MyChart Message (if you have MyChart) OR A paper copy in the mail If you have any lab test that is abnormal or we need to change your treatment, we will call you to review the results.   Testing/Procedures:   None ordered.    Follow-Up: At [facility], you and your health needs are our priority.  As part of our continuing mission to provide you with exceptional heart care, we have created designated Provider Care Teams.  These Care Teams include your primary Cardiologist (physician) and Advanced Practice Providers (APPs -  Physician Assistants and Nurse Practitioners) who all work together to provide you with the care you need, when you need it.  We recommend signing up for the patient portal called "MyChart".  Sign up information is provided on this After Visit Summary.  MyChart is used to connect with patients for Virtual Visits (Telemedicine).  Patients are able to view lab/test results, encounter notes, upcoming appointments, etc.  Non-urgent messages can be sent to your provider as well.   To learn more about what you can do with MyChart, go to [email or web address].    Your next appointment:   6 month(s)  The format for your next appointment:   In Person  Provider:   Donato Schultz, MD     Other Instructions  Your physician wants you to follow-up in: 6 months with Dr. Anne Fu.  You will receive a reminder letter in the mail two months in advance. If you don't receive a letter, please call our office to schedule the follow-up appointment.

## 2021-08-15 NOTE — Assessment & Plan Note (Signed)
Blood pressure is well controlled.  Increase nebivolol to 15 mg daily as noted for palpitations.  I have advised her that she should contact us if she cannot tolerate this dose due to low blood pressure.

## 2021-08-15 NOTE — Assessment & Plan Note (Signed)
She did have a brief episode of atrial tachycardia noted on her monitor.  She does occasionally feel rapid heartbeats.  These are fairly infrequent.  She is mainly bothered by rapid increase in her heart rate with any type of activity.  We discussed continued use of diltiazem 30 mg as needed and maintenance beta-blocker nebivolol 10 mg daily.  We also discussed potentially increasing her beta-blocker dose to see if we can lessen the amount of rapid palpitations she has as well as blunting her heart rate response some with increased activity.  I did advise her that regular exercise will also help improve her heart rate response to activity.  She plans to start an exercise program as well as to lose weight.  She would like to try the higher dose of nebivolol.  Increase nebivolol to 15 mg daily  Continue diltiazem 30 mg as needed  Follow-up 6 months

## 2022-03-03 NOTE — Progress Notes (Unsigned)
Cardiology Office Note:    Date:  03/06/2022   ID:  [name], DOB 01-31-1971, MRN [id number]  PCP:  Sigmund Hazel, MD   [facility] Providers Cardiologist:  Donato Schultz, MD     Referring MD: Sigmund Hazel, MD   Chief Complaint: follow-up palpitations  History of Present Illness:    [name] is a very pleasant 51 y.o. female with a hx of    Palpitations PVCs beta-blocker Rx (Nebivolol >> Atenolol >> Nebivolol) ?hx of NSVT on prior monitor  CAC score 0; CCTA in 7/19: no CAD  Hypertension  PUD ADD  Seen in ED 02/25/2021 for palpitations.  Her Apple Watch was alerting her that she was in A-fib.  This was reviewed by the ED physician and felt to show NSR with PACs.  A ZIO monitor was ordered and this showed NSR, rare PVCs, PACs.  There were some brief episodes of A. tach (10 beats).  Symptoms were reported during NSR.  There were no sustained arrhythmias. She was started on diltiazem 30 mg as needed. Also prescribed nebivolol 10 mg daily.   She was last seen in our office on 08/15/2021 by Tereso Newcomer, PA and reported rapid palpitations that occur about 1 time per month.  Also notes a rapid increase in heart rate with any type of activity, has been an issue for many years.  She feels it has gotten worse with gaining weight.  She was advised to increase nebivolol to 15 mg daily to help with rapid palpitations as well as blunting HR with increased activity.  Was also advised to exercise regularly to improve HR response.  She was advised to follow-up in 6 months.  Today, she is here alone for follow-up. She reports feeling like she is better able to complete daytime activities without symptoms but continues to have symptoms of fluttering at bedtime. Takes nebivolol at 9 pm and goes to bed at 10 pm. Generally wakes up between 11-12 with symptoms if she has them. Takes diltiazem 30 mg on occasion, not very frequently. Frequency varies. Had 2 recent episodes of intense palpitations  while sitting still. Apple watch tracing revealed artifact followed by what appears to be SR with PACs and PVCs. She avoids caffeine, has alcohol on occasion. Has not had increased symptoms on the occasions she had alcohol. Did some yard work this past weekend with no increase in symptoms. Resting HR generally 58 bpm. Feels symptoms have worsened since July because she has been more active. She denies shortness of breath, lower extremity edema, fatigue, diaphoresis, weakness, presyncope, syncope, orthopnea, and PND. Recently joined [employer].   Past Medical History:  Diagnosis Date   ADD (attention deficit disorder)    ALLERGIC RHINITIS 10/10/2007   Qualifier: Diagnosis of  By: Craige Cotta MD, [place]     Anxiety    Chest pain    Hypertension    Intrinsic asthma, unspecified 10/10/2007   Qualifier: Diagnosis of  By: Craige Cotta MD, Vineet     Migraines    Palpitations    Peptic ulcer    VT (ventricular tachycardia) (HCC)     Past Surgical History:  Procedure Laterality Date   CESAREAN SECTION     TUBAL LIGATION      Current Medications: Current Meds  Medication Sig   albuterol (PROVENTIL HFA;VENTOLIN HFA) 108 (90 Base) MCG/ACT inhaler Inhale 1 puff into the lungs as needed for wheezing or shortness of breath.   amphetamine-dextroamphetamine (ADDERALL) 30 MG tablet Take 30  mg by mouth 2 (two) times daily.    B Complex CAPS See admin instructions.   cetirizine (ZYRTEC) 10 MG tablet Take 10 mg by mouth daily.   Cholecalciferol (VITAMIN D3 ULTRA STRENGTH) 125 MCG (5000 UT) capsule See admin instructions.   Collagen-Vitamin C-Biotin (COLLAGEN 1500/C PO) Take 1 capsule by mouth daily.   cyclobenzaprine (FLEXERIL) 10 MG tablet Take 10 mg by mouth 3 (three) times daily as needed for muscle spasms.   diltiazem (CARDIZEM) 30 MG tablet Take 1 tablet (30 mg total) by mouth every 6 (six) hours as needed.   Multiple Vitamin (MULTIVITAMINS PO) Take 1 tablet by mouth daily.    naproxen sodium (ANAPROX) 220 MG  tablet Take 220 mg by mouth as needed (for pain).    nebivolol (BYSTOLIC) 10 MG tablet Take 1.5 tablets (15 mg total) by mouth daily.   rOPINIRole (REQUIP) 1 MG tablet Take 1.5 tablets by mouth at bedtime.   SUMAtriptan (IMITREX) 20 MG/ACT nasal spray 1 Spray(s) Both Nares 1-2 Times Daily     Allergies:   Escitalopram oxalate, Estrogens, Pseudoephedrine, Mirapex [pramipexole dihydrochloride], Strattera [atomoxetine hcl], Sudafed [pseudoephedrine hcl], Sumatriptan, and Bupropion hcl   Social History   Socioeconomic History   Marital status: Divorced    Spouse name: Not on file   Number of children: Not on file   Years of education: Not on file   Highest education level: Not on file  Occupational History   Not on file  Tobacco Use   Smoking status: Former    Types: Cigarettes    Quit date: 06/25/1990    Years since quitting: 31.7   Smokeless tobacco: Never  Vaping Use   Vaping Use: Never used  Substance and Sexual Activity   Alcohol use: Yes    Alcohol/week: 7.0 - 9.0 standard drinks of alcohol    Types: 7 - 9 Glasses of wine per week   Drug use: No   Sexual activity: Yes  Other Topics Concern   Not on file  Social History Narrative   Not on file   Social Determinants of Health   Financial Resource Strain: Not on file  Food Insecurity: Not on file  Transportation Needs: Not on file  Physical Activity: Not on file  Stress: Not on file  Social Connections: Not on file     Family History: The patient's family history includes ADD / ADHD in her daughter and son; Allergic rhinitis in her daughter; Anemia in her sister; Asthma in her mother; Diabetes in her mother; Emphysema in her mother; Heart attack in her maternal uncle and mother; Heart disease in her mother; Hypertension in her mother; Other in her daughter.  ROS:   Please see the history of present illness.    + palpitations All other systems reviewed and are negative.  Labs/Other Studies Reviewed:    The  following studies were reviewed today:  Cardiac Monitor 04/05/21 NSR, avg HR 85; Rare PVCs, PACs; brief parox ATach (10 beats, avg 132); no AF/pauses/VT Sx's of fluttering assoc w NSR   CCTA 02/11/18 CAC score 0; no CAD   ETT 10/08/17 < 1 mm J point depression w down sloping ST segments in inf-lat leads   Echocardiogram 07/30/16 EF 55-60, no RWMA   Holter 07/20/16 Rare PVCs, PACs; no AF/VT; Brief ATach (5 beats)   Recent Labs: No results found for requested labs within last 365 days.  Recent Lipid Panel No results found for: "CHOL", "TRIG", "HDL", "CHOLHDL", "VLDL", "LDLCALC", "LDLDIRECT"  Risk Assessment/Calculations:       Physical Exam:    VS:  BP 122/74   Pulse 78   Ht 5\' 5"  (1.651 m)   Wt 200 lb 9.6 oz (91 kg)   SpO2 98%   BMI 33.38 kg/m     Wt Readings from Last 3 Encounters:  03/06/22 200 lb 9.6 oz (91 kg)  08/15/21 204 lb 12.8 oz (92.9 kg)  02/25/21 200 lb (90.7 kg)     GEN:  Well developed, obese female in no acute distress HEENT: Normal NECK: No JVD; No carotid bruits CARDIAC: RRR. 2/6 systolic murmur on exam. No rubs, gallops RESPIRATORY:  Clear to auscultation without rales, wheezing or rhonchi  ABDOMEN: Soft, non-tender, non-distended MUSCULOSKELETAL:  No edema; No deformity. 2+ pedal pulses, equal bilaterally SKIN: Warm and dry NEUROLOGIC:  Alert and oriented x 3 PSYCHIATRIC:  Normal affect   EKG:  EKG is not ordered today.      Diagnoses:    1. Palpitations   2. Murmur   3. Essential hypertension    Assessment and Plan:     Palpitations: Increase in palpitations over the previous month. Lengthy discussion about potential causes. Suspect that some symptoms are 2/2 deconditioning since she has become more active and is losing weight. Encouraged increased physical activity to achieve 150 minutes of moderate exercise each week. Advised her to notify us if symptoms worsen with exercise. We discussed changes in medications, however she would like  to continue on nebivolol 15 mg daily and diltiazem 30 mg as needed for now. Will check bmet and magnesium today. Encouraged her to keep K+ > 4.0 and magnesium > 2.0. Will add supplementation if needed.   Hypertension: BP is well-controlled today. She reports an occasionally elevated reading at home, no concerns for consistently high BP. No medication changes today.   Murmur: Slight systolic murmur on exam. No indication of valve abnormality on echo 2018. Will update echo for evaluation of structural heart disease.  Disposition: 6 months with Dr. Marlou Porch  Medication Adjustments/Labs and Tests Ordered: Current medicines are reviewed at length with the patient today.  Concerns regarding medicines are outlined above.  Orders Placed This Encounter  Procedures   Magnesium   Basic Metabolic Panel (BMET)   ECHOCARDIOGRAM COMPLETE   No orders of the defined types were placed in this encounter.   Patient Instructions  Medication Instructions:   Your physician recommends that you continue on your current medications as directed. Please refer to the Current Medication list given to you today.   *If you need a refill on your cardiac medications before your next appointment, please call your pharmacy*   Lab Work:  TODAY!!!!BMET/MAG  If you have labs (blood work) drawn today and your tests are completely normal, you will receive your results only by: [place] (if you have MyChart) OR A paper copy in the mail If you have any lab test that is abnormal or we need to change your treatment, we will call you to review the results.   Testing/Procedures:  Your physician has requested that you have an echocardiogram. Echocardiography is a painless test that uses sound waves to create images of your heart. It provides your doctor with information about the size and shape of your heart and how well your heart's chambers and valves are working. This procedure takes approximately one hour. There  are no restrictions for this procedure.    Follow-Up: At [facility], you and your health needs are our  priority.  As part of our continuing mission to provide you with exceptional heart care, we have created designated Provider Care Teams.  These Care Teams include your primary Cardiologist (physician) and Advanced Practice Providers (APPs -  Physician Assistants and Nurse Practitioners) who all work together to provide you with the care you need, when you need it.  We recommend signing up for the patient portal called "MyChart".  Sign up information is provided on this After Visit Summary.  MyChart is used to connect with patients for Virtual Visits (Telemedicine).  Patients are able to view lab/test results, encounter notes, upcoming appointments, etc.  Non-urgent messages can be sent to your provider as well.   To learn more about what you can do with MyChart, go to [email or web address].    Your next appointment:   6 month(s)  The format for your next appointment:   In Person  Provider:   Candee Furbish, MD     Other Instructions  Adopting a Healthy Lifestyle.   Weight: Know what a healthy weight is for you (roughly BMI <25) and aim to maintain this. You can calculate your body mass index on your smart phone  Diet: Aim for 7+ servings of fruits and vegetables daily Limit animal fats in diet for cholesterol and heart health - choose grass fed whenever available Avoid highly processed foods (fast food burgers, tacos, fried chicken, pizza, hot dogs, french fries)  Saturated fat comes in the form of butter, lard, coconut oil, margarine, partially hydrogenated oils, and fat in meat. These increase your risk of cardiovascular disease.  Use healthy plant oils, such as olive, canola, soy, corn, sunflower and peanut.  Whole foods such as fruits, vegetables and whole grains have fiber  Men need > 38 grams of fiber per day Women need > 25 grams of fiber per day  Load up on  vegetables and fruits - one-half of your plate: Aim for color and variety, and remember that potatoes dont count. Go for whole grains - one-quarter of your plate: Whole wheat, barley, wheat berries, quinoa, oats, brown rice, and foods made with them. If you want pasta, go with whole wheat pasta. Protein power - one-quarter of your plate: Fish, chicken, beans, and nuts are all healthy, versatile protein sources. Limit red meat. You need carbohydrates for energy! The type of carbohydrate is more important than the amount. Choose carbohydrates such as vegetables, fruits, whole grains, beans, and nuts in the place of white rice, white pasta, potatoes (baked or fried), macaroni and cheese, cakes, cookies, and donuts.  If youre thirsty, drink water. Coffee and tea are good in moderation, but skip sugary drinks and limit milk and dairy products to one or two daily servings. Keep sugar intake at 6 teaspoons or 24 grams or LESS       Exercise: Aim for 150 min of moderate intensity exercise weekly for heart health, and weights twice weekly for bone health Stay active - any steps are better than no steps! Aim for 7-9 hours of sleep daily        Important Information About Sugar         Signed, Emmaline Life, NP  03/06/2022 5:36 PM    [place]

## 2022-03-06 ENCOUNTER — Ambulatory Visit: Payer: 59 | Attending: Nurse Practitioner | Admitting: Nurse Practitioner

## 2022-03-06 ENCOUNTER — Encounter: Payer: Self-pay | Admitting: Nurse Practitioner

## 2022-03-06 VITALS — BP 122/74 | HR 78 | Ht 65.0 in | Wt 200.6 lb

## 2022-03-06 DIAGNOSIS — R011 Cardiac murmur, unspecified: Secondary | ICD-10-CM | POA: Diagnosis not present

## 2022-03-06 DIAGNOSIS — R002 Palpitations: Secondary | ICD-10-CM | POA: Diagnosis not present

## 2022-03-06 DIAGNOSIS — I10 Essential (primary) hypertension: Secondary | ICD-10-CM | POA: Diagnosis not present

## 2022-03-06 NOTE — Patient Instructions (Addendum)
Medication Instructions:   Your physician recommends that you continue on your current medications as directed. Please refer to the Current Medication list given to you today.   *If you need a refill on your cardiac medications before your next appointment, please call your pharmacy*   Lab Work:  TODAY!!!!BMET/MAG  If you have labs (blood work) drawn today and your tests are completely normal, you will receive your results only by: MyChart Message (if you have MyChart) OR A paper copy in the mail If you have any lab test that is abnormal or we need to change your treatment, we will call you to review the results.   Testing/Procedures:  Your physician has requested that you have an echocardiogram. Echocardiography is a painless test that uses sound waves to create images of your heart. It provides your doctor with information about the size and shape of your heart and how well your heart's chambers and valves are working. This procedure takes approximately one hour. There are no restrictions for this procedure.    Follow-Up: At [facility], you and your health needs are our priority.  As part of our continuing mission to provide you with exceptional heart care, we have created designated Provider Care Teams.  These Care Teams include your primary Cardiologist (physician) and Advanced Practice Providers (APPs -  Physician Assistants and Nurse Practitioners) who all work together to provide you with the care you need, when you need it.  We recommend signing up for the patient portal called "MyChart".  Sign up information is provided on this After Visit Summary.  MyChart is used to connect with patients for Virtual Visits (Telemedicine).  Patients are able to view lab/test results, encounter notes, upcoming appointments, etc.  Non-urgent messages can be sent to your provider as well.   To learn more about what you can do with MyChart, go to [email or web address].    Your next  appointment:   6 month(s)  The format for your next appointment:   In Person  Provider:   Donato Schultz, MD     Other Instructions  Adopting a Healthy Lifestyle.   Weight: Know what a healthy weight is for you (roughly BMI <25) and aim to maintain this. You can calculate your body mass index on your smart phone  Diet: Aim for 7+ servings of fruits and vegetables daily Limit animal fats in diet for cholesterol and heart health - choose grass fed whenever available Avoid highly processed foods (fast food burgers, tacos, fried chicken, pizza, hot dogs, french fries)  Saturated fat comes in the form of butter, lard, coconut oil, margarine, partially hydrogenated oils, and fat in meat. These increase your risk of cardiovascular disease.  Use healthy plant oils, such as olive, canola, soy, corn, sunflower and peanut.  Whole foods such as fruits, vegetables and whole grains have fiber  Men need > 38 grams of fiber per day Women need > 25 grams of fiber per day  Load up on vegetables and fruits - one-half of your plate: Aim for color and variety, and remember that potatoes dont count. Go for whole grains - one-quarter of your plate: Whole wheat, barley, wheat berries, quinoa, oats, brown rice, and foods made with them. If you want pasta, go with whole wheat pasta. Protein power - one-quarter of your plate: Fish, chicken, beans, and nuts are all healthy, versatile protein sources. Limit red meat. You need carbohydrates for energy! The type of carbohydrate is more important than the amount. Choose carbohydrates  such as vegetables, fruits, whole grains, beans, and nuts in the place of white rice, white pasta, potatoes (baked or fried), macaroni and cheese, cakes, cookies, and donuts.  If youre thirsty, drink water. Coffee and tea are good in moderation, but skip sugary drinks and limit milk and dairy products to one or two daily servings. Keep sugar intake at 6 teaspoons or 24 grams or LESS        Exercise: Aim for 150 min of moderate intensity exercise weekly for heart health, and weights twice weekly for bone health Stay active - any steps are better than no steps! Aim for 7-9 hours of sleep daily        Important Information About Sugar

## 2022-03-08 LAB — BASIC METABOLIC PANEL
BUN/Creatinine Ratio: 15 (ref 9–23)
BUN: 18 mg/dL (ref 6–24)
CO2: 25 mmol/L (ref 20–29)
Calcium: 10 mg/dL (ref 8.7–10.2)
Chloride: 100 mmol/L (ref 96–106)
Creatinine, Ser: 1.24 mg/dL — ABNORMAL HIGH (ref 0.57–1.00)
Glucose: 111 mg/dL — ABNORMAL HIGH (ref 70–99)
Potassium: 4.5 mmol/L (ref 3.5–5.2)
Sodium: 141 mmol/L (ref 134–144)
eGFR: 52 mL/min/{1.73_m2} — ABNORMAL LOW (ref >59)

## 2022-03-08 LAB — MAGNESIUM: Magnesium: 2.3 mg/dL (ref 1.6–2.3)

## 2022-03-19 ENCOUNTER — Telehealth (HOSPITAL_COMMUNITY): Payer: Self-pay | Admitting: Nurse Practitioner

## 2022-03-19 NOTE — Telephone Encounter (Signed)
Patient called and cancelled Echocardiogram for the reason below:  03/19/2022 12:11 PM UM:PNTIRW, ANGELINE S  Cancel Rsn: Patient (pt cant make it and will cb to r/s)  Order will be removed from the Active echo WQ and when pt calls back we will reinstate the order.

## 2022-03-20 ENCOUNTER — Encounter (HOSPITAL_COMMUNITY): Payer: Self-pay

## 2022-03-20 ENCOUNTER — Other Ambulatory Visit (HOSPITAL_COMMUNITY): Payer: 59

## 2022-04-19 ENCOUNTER — Ambulatory Visit (INDEPENDENT_AMBULATORY_CARE_PROVIDER_SITE_OTHER): Payer: 59

## 2022-04-19 ENCOUNTER — Ambulatory Visit: Payer: 59 | Admitting: Podiatry

## 2022-04-19 DIAGNOSIS — M7751 Other enthesopathy of right foot: Secondary | ICD-10-CM

## 2022-04-19 DIAGNOSIS — M722 Plantar fascial fibromatosis: Secondary | ICD-10-CM

## 2022-04-19 DIAGNOSIS — M79671 Pain in right foot: Secondary | ICD-10-CM | POA: Diagnosis not present

## 2022-04-19 NOTE — Patient Instructions (Signed)

## 2022-04-19 NOTE — Progress Notes (Signed)
  Subjective:  Patient ID: [name], female    DOB: [birth year],  MRN: [id number]  Chief Complaint  Patient presents with   Foot Pain    Room 16  Pt states she is having sharp pain when she step on right foot, any other time she has a dull pain on it , this has been going on since July     50 y.o. female presents with the above complaint. Presents with pain in the right heel. Feels pain with walking on the area and notes stabbing pains. Seems to get worse as day goes on. Has tried icing and ibuprofen but not helping the pain. Was told not to take any further ibuprofen per her PCP.    Review of Systems: Negative except as noted in the HPI. Denies N/V/F/Ch.   Objective:  There were no vitals filed for this visit. There is no height or weight on file to calculate BMI. Constitutional Well developed. Well nourished.  Vascular Dorsalis pedis pulses palpable bilaterally. Posterior tibial pulses palpable bilaterally. Capillary refill normal to all digits.  No cyanosis or clubbing noted. Pedal hair growth normal.  Neurologic Normal speech. Oriented to person, place, and time. Epicritic sensation to light touch grossly present bilaterally.  Dermatologic Nails well groomed and normal in appearance. No open wounds. No skin lesions.  Orthopedic: Normal joint ROM without pain or crepitus bilaterally. No visible deformities. Tender to palpation at the plantar calcaneal bursae as well as the plantar calc tuber right . No pain with calcaneal squeeze right. Ankle ROM diminished range of motion right. Silfverskiold Test: negative right.   Radiographs: Taken and reviewed. No acute fractures or dislocations. No evidence of stress fracture.  Plantar heel spur present. Posterior heel spur absent.   Assessment:   1. Pain of right heel    Plan:  Patient was evaluated and treated and all questions answered.  Plantar Fasciitis vs plantar heel bursitis, right - XR reviewed as above.  -  Educated on icing and stretching. Instructions given.  - Injection delivered to the plantar calcaneal bursae as below. - DME: Plantar fascial brace dispensed - Pharmacologic management:  holding on NSAID due to patient reported kideny issue.   Procedure: Injection Plantar calcaneal bursae  Location: Right plantar calcaneal bursae at the glabrous junction; medial approach. Skin Prep: alcohol Injectate: 1 cc 0.5% marcaine plain, 1 cc kenalog 10. Disposition: Patient tolerated procedure well. Injection site dressed with a band-aid.  No follow-ups on file.

## 2022-04-20 ENCOUNTER — Other Ambulatory Visit: Payer: Self-pay | Admitting: Podiatry

## 2022-04-20 DIAGNOSIS — M722 Plantar fascial fibromatosis: Secondary | ICD-10-CM

## 2022-05-17 ENCOUNTER — Ambulatory Visit: Payer: 59 | Admitting: Podiatry

## 2022-06-15 ENCOUNTER — Other Ambulatory Visit: Payer: Self-pay | Admitting: Physician Assistant

## 2022-07-10 ENCOUNTER — Telehealth: Payer: 59 | Admitting: Physician Assistant

## 2022-07-10 DIAGNOSIS — J069 Acute upper respiratory infection, unspecified: Secondary | ICD-10-CM | POA: Diagnosis not present

## 2022-07-10 MED ORDER — FLUTICASONE PROPIONATE 50 MCG/ACT NA SUSP: 2.0000 | Freq: Every day | NASAL | 0 refills | Status: AC

## 2022-07-10 MED ORDER — BENZONATATE 100 MG PO CAPS: 100.0000 mg | ORAL_CAPSULE | Freq: Three times a day (TID) | ORAL | 0 refills | Status: DC | PRN

## 2022-07-10 NOTE — Progress Notes (Signed)

## 2022-07-10 NOTE — Progress Notes (Signed)
I have spent 5 minutes in review of e-visit questionnaire, review and updating patient chart, medical decision making and response to patient.   Vanita Cannell Cody Kimsey Demaree, PA-C    

## 2022-09-04 ENCOUNTER — Ambulatory Visit: Payer: 59 | Attending: Cardiology | Admitting: Cardiology

## 2022-09-04 ENCOUNTER — Encounter: Payer: Self-pay | Admitting: Cardiology

## 2022-09-04 VITALS — BP 150/98 | HR 80 | Ht 65.0 in | Wt 206.0 lb

## 2022-09-04 DIAGNOSIS — Z79899 Other long term (current) drug therapy: Secondary | ICD-10-CM

## 2022-09-04 DIAGNOSIS — I10 Essential (primary) hypertension: Secondary | ICD-10-CM | POA: Diagnosis not present

## 2022-09-04 DIAGNOSIS — R002 Palpitations: Secondary | ICD-10-CM | POA: Diagnosis not present

## 2022-09-04 MED ORDER — HYDROCHLOROTHIAZIDE 25 MG PO TABS: 25.0000 mg | ORAL_TABLET | Freq: Every day | ORAL | 3 refills | Status: AC

## 2022-09-04 NOTE — Patient Instructions (Signed)
Medication Instructions:  Please start Hydrochlorothiazide 25 mg once daily. Continue all other medications as listed.  *If you need a refill on your cardiac medications before your next appointment, please call your pharmacy*   Lab Work: Please have blood work in 2 weeks (BMP)  If you have labs (blood work) drawn today and your tests are completely normal, you will receive your results only by: [place] (if you have MyChart) OR A paper copy in the mail If you have any lab test that is abnormal or we need to change your treatment, we will call you to review the results.   Follow-Up: At [facility], you and your health needs are our priority.  As part of our continuing mission to provide you with exceptional heart care, we have created designated Provider Care Teams.  These Care Teams include your primary Cardiologist (physician) and Advanced Practice Providers (APPs -  Physician Assistants and Nurse Practitioners) who all work together to provide you with the care you need, when you need it.  We recommend signing up for the patient portal called "MyChart".  Sign up information is provided on this After Visit Summary.  MyChart is used to connect with patients for Virtual Visits (Telemedicine).  Patients are able to view lab/test results, encounter notes, upcoming appointments, etc.  Non-urgent messages can be sent to your provider as well.   To learn more about what you can do with MyChart, go to [email or web address].    Your next appointment:   3 month(s)  Provider:   Nicholes Rough, PA-C, Ambrose Pancoast, NP, Christen Bame, NP, or Richardson Dopp, PA-C     Then, Candee Furbish, MD will plan to see you again in 1 year(s).

## 2022-09-04 NOTE — Progress Notes (Signed)
Cardiology Office Note:    Date:  09/04/2022   ID:  [name], DOB 1970-10-20, MRN [id number]  PCP:  Kathyrn Lass, MD   Pump Back  Cardiologist:  Candee Furbish, MD  Advanced Practice Provider:  No care team member to display Electrophysiologist:  None       Referring MD: Kathyrn Lass, MD    History of Present Illness:    [name] is a 52 y.o. female here for follow-up of chest pain. Thankfully coronary calcium was 0 in 2019. No evidence of coronary artery disease on CT scan in 2019. Palpitations have been previously treated with Bystolic, then atenolol, back on Bystolic 15 mg.   Overall doing quite well. Adderall being utilized for ADHD.  Went back to emergency room in 2022 when her Apple Watch thought she was in atrial fibrillation.  The ER doctor thought it was normal sinus rhythm with PACs.  ZIO monitor showed no atrial fibrillation.  Brief atrial tachycardia 10 beats noted.  She was given diltiazem 30 mg as needed.  Working on [employer].  Tight blood pressure has been noted especially at home.  Today she is elevated.  We are starting HCTZ 25 mg  No fevers chills nausea vomiting syncope bleeding.  Past Medical History:  Diagnosis Date   ADD (attention deficit disorder)    ALLERGIC RHINITIS 10/10/2007   Qualifier: Diagnosis of  By: Halford Chessman MD, [place]     Anxiety    Chest pain    Hypertension    Intrinsic asthma, unspecified 10/10/2007   Qualifier: Diagnosis of  By: Halford Chessman MD, [place]     Migraines    Palpitations    Peptic ulcer    VT (ventricular tachycardia) (HCC)     Past Surgical History:  Procedure Laterality Date   CESAREAN SECTION     TUBAL LIGATION      Current Medications: Current Meds  Medication Sig   albuterol (PROVENTIL HFA;VENTOLIN HFA) 108 (90 Base) MCG/ACT inhaler Inhale 1 puff into the lungs as needed for wheezing or shortness of breath.   amphetamine-dextroamphetamine (ADDERALL) 30 MG tablet Take 30 mg by mouth 2  (two) times daily.    B Complex CAPS See admin instructions.   cetirizine (ZYRTEC) 10 MG tablet Take 10 mg by mouth daily.   Cholecalciferol (VITAMIN D3 ULTRA STRENGTH) 125 MCG (5000 UT) capsule See admin instructions.   Collagen-Vitamin C-Biotin (COLLAGEN 1500/C PO) Take 1 capsule by mouth daily.   cyclobenzaprine (FLEXERIL) 10 MG tablet Take 10 mg by mouth 3 (three) times daily as needed for muscle spasms.   diltiazem (CARDIZEM) 30 MG tablet Take 1 tablet (30 mg total) by mouth every 6 (six) hours as needed.   fluticasone (FLONASE) 50 MCG/ACT nasal spray Place 2 sprays into both nostrils daily.   hydrochlorothiazide (HYDRODIURIL) 25 MG tablet Take 1 tablet (25 mg total) by mouth daily.   Multiple Vitamin (MULTIVITAMINS PO) Take 1 tablet by mouth daily.    naproxen sodium (ANAPROX) 220 MG tablet Take 220 mg by mouth as needed (for pain).    nebivolol (BYSTOLIC) 10 MG tablet TAKE 1 AND 1/2 TABLETS BY MOUTH  DAILY   rOPINIRole (REQUIP) 1 MG tablet Take 1.5 tablets by mouth at bedtime.   SUMAtriptan (IMITREX) 20 MG/ACT nasal spray 1 Spray(s) Both Nares 1-2 Times Daily     Allergies:   Escitalopram oxalate, Estrogens, Pseudoephedrine, Mirapex [pramipexole dihydrochloride], Strattera [atomoxetine hcl], Sudafed [pseudoephedrine hcl], Sumatriptan, and Bupropion hcl  Social History   Socioeconomic History   Marital status: Divorced    Spouse name: Not on file   Number of children: Not on file   Years of education: Not on file   Highest education level: Not on file  Occupational History   Not on file  Tobacco Use   Smoking status: Former    Types: Cigarettes    Quit date: 06/25/1990    Years since quitting: 32.2   Smokeless tobacco: Never  Vaping Use   Vaping Use: Never used  Substance and Sexual Activity   Alcohol use: Yes    Alcohol/week: 7.0 - 9.0 standard drinks of alcohol    Types: 7 - 9 Glasses of wine per week   Drug use: No   Sexual activity: Yes  Other Topics Concern    Not on file  Social History Narrative   Not on file   Social Determinants of Health   Financial Resource Strain: Not on file  Food Insecurity: Not on file  Transportation Needs: Not on file  Physical Activity: Not on file  Stress: Not on file  Social Connections: Not on file     Family History: The patient's family history includes ADD / ADHD in her daughter and son; Allergic rhinitis in her daughter; Anemia in her sister; Asthma in her mother; Diabetes in her mother; Emphysema in her mother; Heart attack in her maternal uncle and mother; Heart disease in her mother; Hypertension in her mother; Other in her daughter.  ROS:   Please see the history of present illness.     All other systems reviewed and are negative.  EKGs/Labs/Other Studies Reviewed:    The following studies were reviewed today:  Cardiac Monitor 04/05/21 NSR, avg HR 85; Rare PVCs, PACs; brief parox ATach (10 beats, avg 132); no AF/pauses/VT Sx's of fluttering assoc w NSR   CCTA 02/11/18 CAC score 0; no CAD   ETT 10/08/17 < 1 mm J point depression w down sloping ST segments in inf-lat leads  Holter 2018  Rare PVC (3) and PAC's (27) Brief atrial tachycardia (5 beats) No atrial fibrillation No ventricular tachycardia Reassuring monitor.   ECHO 2018 - Left ventricle: The cavity size was normal. Systolic function was    normal. The estimated ejection fraction was in the range of 55%    to 60%. Wall motion was normal; there were no regional wall    motion abnormalities. Left ventricular diastolic function    parameters were normal.   EKG:    prior SB 59  Recent Labs: 03/06/2022: BUN 18; Creatinine, Ser 1.24; Magnesium 2.3; Potassium 4.5; Sodium 141  Recent Lipid Panel No results found for: "CHOL", "TRIG", "HDL", "CHOLHDL", "VLDL", "LDLCALC", "LDLDIRECT"   Risk Assessment/Calculations:      Physical Exam:    VS:  BP (!) 150/98 (BP Location: Left Arm, Patient Position: Sitting, Cuff Size: Normal)    Pulse 80   Ht '5\' 5"'$  (1.651 m)   Wt 206 lb (93.4 kg)   BMI 34.28 kg/m     Wt Readings from Last 3 Encounters:  09/04/22 206 lb (93.4 kg)  03/06/22 200 lb 9.6 oz (91 kg)  08/15/21 204 lb 12.8 oz (92.9 kg)     GEN:  Well nourished, well developed in no acute distress HEENT: Normal NECK: No JVD; No carotid bruits LYMPHATICS: No lymphadenopathy CARDIAC: RRR, no murmurs, rubs, gallops RESPIRATORY:  Clear to auscultation without rales, wheezing or rhonchi  ABDOMEN: Soft, non-tender, non-distended MUSCULOSKELETAL:  No edema;  No deformity  SKIN: Warm and dry NEUROLOGIC:  Alert and oriented x 3 PSYCHIATRIC:  Normal affect   ASSESSMENT:    1. Palpitations   2. Essential hypertension   3. Medication management     PLAN:    In order of problems listed above:  Palpitations -Prior PVCs, Bystolic 15 mg fairly effective. She had to switch over to atenolol because of cost but now on Bystolic. Seems to be doing quite well with this. Occasional rapid heart rates at night. -Prior EF reassuring in 2018. Also taking Adderall -Still feeling some palpitations in the middle of the night.  Offered her the possibility of utilizing flecainide for instance.  For now, we will continue with current treatment strategy.  Essential hypertension - We will start HCTZ 25 mg once a day.  Check basic metabolic profile in 2 weeks.  Will have her see APP in 3 months to check up on her blood pressure.  We can always add ARB such as irbesartan in the future.  Amlodipine is an option as well.  Blood pressure 150/98 here.  She does stated that home her blood pressures have been similar.  Family history of CAD -Her mother died at age 2 from heart failure.  Her mother did move into an apartment complex, asthma seemed to worsen etc.  Thankfully her CT scan currently does not show any evidence of coronary calcification, calcium score was 0 in 01/2018. We are continuing with exercise.  Weight loss-working with weight  watchers.  I did mention the GLP-1 agonist to her.  Perhaps she could be preauthorized for [facility].  I know that there has been a Psychologist, prison and probation services however.  If she is interested, we could ask our pharmacy team for assistance.  Medication Adjustments/Labs and Tests Ordered: Current medicines are reviewed at length with the patient today.  Concerns regarding medicines are outlined above.  Orders Placed This Encounter  Procedures   Basic metabolic panel   EKG [id number]   Meds ordered this encounter  Medications   hydrochlorothiazide (HYDRODIURIL) 25 MG tablet    Sig: Take 1 tablet (25 mg total) by mouth daily.    Dispense:  90 tablet    Refill:  3    Patient Instructions  Medication Instructions:  Please start Hydrochlorothiazide 25 mg once daily. Continue all other medications as listed.  *If you need a refill on your cardiac medications before your next appointment, please call your pharmacy*   Lab Work: Please have blood work in 2 weeks (BMP)  If you have labs (blood work) drawn today and your tests are completely normal, you will receive your results only by: [place] (if you have MyChart) OR A paper copy in the mail If you have any lab test that is abnormal or we need to change your treatment, we will call you to review the results.   Follow-Up: At [facility], you and your health needs are our priority.  As part of our continuing mission to provide you with exceptional heart care, we have created designated Provider Care Teams.  These Care Teams include your primary Cardiologist (physician) and Advanced Practice Providers (APPs -  Physician Assistants and Nurse Practitioners) who all work together to provide you with the care you need, when you need it.  We recommend signing up for the patient portal called "MyChart".  Sign up information is provided on this After Visit Summary.  MyChart is used to connect with patients for Virtual Visits (Telemedicine).   Patients  are able to view lab/test results, encounter notes, upcoming appointments, etc.  Non-urgent messages can be sent to your provider as well.   To learn more about what you can do with MyChart, go to [email or web address].    Your next appointment:   3 month(s)  Provider:   Nicholes Rough, PA-C, Ambrose Pancoast, NP, Christen Bame, NP, or Richardson Dopp, PA-C     Then, Candee Furbish, MD will plan to see you again in 1 year(s).       Signed, Candee Furbish, MD  09/04/2022 3:51 PM    [place]

## 2022-09-21 ENCOUNTER — Ambulatory Visit: Payer: 59 | Attending: Cardiology

## 2022-09-21 DIAGNOSIS — R002 Palpitations: Secondary | ICD-10-CM

## 2022-09-21 DIAGNOSIS — Z79899 Other long term (current) drug therapy: Secondary | ICD-10-CM

## 2022-09-21 DIAGNOSIS — I10 Essential (primary) hypertension: Secondary | ICD-10-CM

## 2022-09-22 LAB — BASIC METABOLIC PANEL
BUN/Creatinine Ratio: 30 — ABNORMAL HIGH (ref 9–23)
BUN: 23 mg/dL (ref 6–24)
CO2: 23 mmol/L (ref 20–29)
Calcium: 9.8 mg/dL (ref 8.7–10.2)
Chloride: 102 mmol/L (ref 96–106)
Creatinine, Ser: 0.76 mg/dL (ref 0.57–1.00)
Glucose: 127 mg/dL — ABNORMAL HIGH (ref 70–99)
Potassium: 4.4 mmol/L (ref 3.5–5.2)
Sodium: 140 mmol/L (ref 134–144)
eGFR: 94 mL/min/{1.73_m2} (ref >59)

## 2022-11-15 ENCOUNTER — Ambulatory Visit: Payer: 59 | Admitting: Podiatry

## 2022-11-15 DIAGNOSIS — M7751 Other enthesopathy of right foot: Secondary | ICD-10-CM

## 2022-11-15 NOTE — Progress Notes (Signed)
  Subjective:  Patient ID: [name], female    DOB: March 25, 1971,  MRN: [id number]  Chief Complaint  Patient presents with   Foot Pain    Right foot injection    52 y.o. female presents for follow-up of right heel bursitis pain.  Patient was previously seen April 19, 2022.  She was injected with steroid into the right heel which she says relieved her pain for a long time.  It was enough to cancel her appointment as she was doing well.  She states the pain has returned in the right heel at this time.  She is using a Dr. Margart Sickles insert and also has gel heel cups as well.  Review of Systems: Negative except as noted in the HPI. Denies N/V/F/Ch.   Objective:  There were no vitals filed for this visit. There is no height or weight on file to calculate BMI. Constitutional Well developed. Well nourished.  Vascular Dorsalis pedis pulses palpable bilaterally. Posterior tibial pulses palpable bilaterally. Capillary refill normal to all digits.  No cyanosis or clubbing noted. Pedal hair growth normal.  Neurologic Normal speech. Oriented to person, place, and time. Epicritic sensation to light touch grossly present bilaterally.  Dermatologic Nails well groomed and normal in appearance. No open wounds. No skin lesions.  Orthopedic: Normal joint ROM without pain or crepitus bilaterally. No visible deformities. Tender to palpation at the plantar calcaneal bursae as well as the plantar calc tuber right . No pain with calcaneal squeeze right. Ankle ROM diminished range of motion right. Silf[place]old Test: negative right.   Radiographs: Taken and reviewed. No acute fractures or dislocations. No evidence of stress fracture.  Plantar heel spur present. Posterior heel spur absent.   Assessment:   1. Calcaneal bursitis (heel), right     Plan:  Patient was evaluated and treated and all questions answered.  Plantar heel bursitis, right - XR reviewed as above.  - Educated on icing and  stretching. Instructions given.  - Injection delivered to the plantar calcaneal bursae as below. - DME: Continue with gel heel cups - Pharmacologic management: Tylenol as needed for pain  Procedure: Injection Plantar calcaneal bursae  Location: Right plantar calcaneal bursae at the glabrous junction; medial approach. Skin Prep: alcohol Injectate: 1 cc 0.5% marcaine plain, 1 cc kenalog 10. Disposition: Patient tolerated procedure well. Injection site dressed with a band-aid.  Return if symptoms worsen or fail to improve.

## 2022-11-30 ENCOUNTER — Encounter: Payer: Self-pay | Admitting: Physician Assistant

## 2022-11-30 ENCOUNTER — Ambulatory Visit: Payer: 59 | Attending: Physician Assistant | Admitting: Physician Assistant

## 2022-11-30 VITALS — BP 128/68 | HR 86 | Ht 65.0 in | Wt 212.0 lb

## 2022-11-30 DIAGNOSIS — I10 Essential (primary) hypertension: Secondary | ICD-10-CM

## 2022-11-30 DIAGNOSIS — R002 Palpitations: Secondary | ICD-10-CM

## 2022-11-30 DIAGNOSIS — R011 Cardiac murmur, unspecified: Secondary | ICD-10-CM | POA: Diagnosis not present

## 2022-11-30 DIAGNOSIS — Z79899 Other long term (current) drug therapy: Secondary | ICD-10-CM | POA: Diagnosis not present

## 2022-11-30 DIAGNOSIS — R55 Syncope and collapse: Secondary | ICD-10-CM

## 2022-11-30 NOTE — Patient Instructions (Addendum)
Medication Instructions:  Your physician recommends that you continue on your current medications as directed. Please refer to the Current Medication list given to you today.  *If you need a refill on your cardiac medications before your next appointment, please call your pharmacy*  Lab Work: None ordered If you have labs (blood work) drawn today and your tests are completely normal, you will receive your results only by: MyChart Message (if you have MyChart) OR A paper copy in the mail If you have any lab test that is abnormal or we need to change your treatment, we will call you to review the results.  Testing/Procedures: Your physician has requested that you have an echocardiogram. Echocardiography is a painless test that uses sound waves to create images of your heart. It provides your doctor with information about the size and shape of your heart and how well your heart's chambers and valves are working. This procedure takes approximately one hour. There are no restrictions for this procedure. Please do NOT wear cologne, perfume, aftershave, or lotions (deodorant is allowed). Please arrive 15 minutes prior to your appointment time.   Follow-Up: At [facility], you and your health needs are our priority.  As part of our continuing mission to provide you with exceptional heart care, we have created designated Provider Care Teams.  These Care Teams include your primary Cardiologist (physician) and Advanced Practice Providers (APPs -  Physician Assistants and Nurse Practitioners) who all work together to provide you with the care you need, when you need it.  Your next appointment:   6 month(s)  Provider:   Donato Schultz, MD    Other Instructions Check your blood pressure daily, 1 hr after morning medications for 2 weeks, keep a log and send [place] the readings through mychart at the end of the 2 weeks.    Low-Sodium Eating Plan Salt (sodium) helps you keep a healthy balance of  fluids in your body. Too much sodium can raise your blood pressure. It can also cause fluid and waste to be held in your body. Your health care provider or dietitian may recommend a low-sodium eating plan if you have high blood pressure (hypertension), kidney disease, liver disease, or heart failure. Eating less sodium can help lower your blood pressure and reduce swelling. It can also protect your heart, liver, and kidneys. What are tips for following this plan? Reading food labels  Check food labels for the amount of sodium per serving. If you eat more than one serving, you must multiply the listed amount by the number of servings. Choose foods with less than 140 milligrams (mg) of sodium per serving. Avoid foods with 300 mg of sodium or more per serving. Always check how much sodium is in a product, even if the label says "unsalted" or "no salt added." Shopping  Buy products labeled as "low-sodium" or "no salt added." Buy fresh foods. Avoid canned foods and pre-made or frozen meals. Avoid canned, cured, or processed meats. Buy breads that have less than 80 mg of sodium per slice. Cooking  Eat more home-cooked food. Try to eat less restaurant, buffet, and fast food. Try not to add salt when you cook. Use salt-free seasonings or herbs instead of table salt or sea salt. Check with your provider or pharmacist before using salt substitutes. Cook with plant-based oils, such as canola, sunflower, or olive oil. Meal planning When eating at a restaurant, ask if your food can be made with less salt or no salt. Avoid  dishes labeled as brined, pickled, cured, or smoked. Avoid dishes made with soy sauce, miso, or teriyaki sauce. Avoid foods that have monosodium glutamate (MSG) in them. MSG may be added to some restaurant food, sauces, soups, bouillon, and canned foods. Make meals that can be grilled, baked, poached, roasted, or steamed. These are often made with less sodium. General information Try  to limit your sodium intake to 1,500-2,300 mg each day, or the amount told by your provider. What foods should I eat? Fruits Fresh, frozen, or canned fruit. Fruit juice. Vegetables Fresh or frozen vegetables. "No salt added" canned vegetables. "No salt added" tomato sauce and paste. Low-sodium or reduced-sodium tomato and vegetable juice. Grains Low-sodium cereals, such as oats, puffed wheat and rice, and shredded wheat. Low-sodium crackers. Unsalted rice. Unsalted pasta. Low-sodium bread. Whole grain breads and whole grain pasta. Meats and other proteins Fresh or frozen meat, poultry, seafood, and fish. These should have no added salt. Low-sodium canned tuna and salmon. Unsalted nuts. Dried peas, beans, and lentils without added salt. Unsalted canned beans. Eggs. Unsalted nut butters. Dairy Milk. Soy milk. Cheese that is naturally low in sodium, such as ricotta cheese, fresh mozzarella, or Swiss cheese. Low-sodium or reduced-sodium cheese. Cream cheese. Yogurt. Seasonings and condiments Fresh and dried herbs and spices. Salt-free seasonings. Low-sodium mustard and ketchup. Sodium-free salad dressing. Sodium-free light mayonnaise. Fresh or refrigerated horseradish. Lemon juice. Vinegar. Other foods Homemade, reduced-sodium, or low-sodium soups. Unsalted popcorn and pretzels. Low-salt or salt-free chips. The items listed above may not be all the foods and drinks you can have. Talk to a dietitian to learn more. What foods should I avoid? Vegetables Sauerkraut, pickled vegetables, and relishes. Olives. [place] fries. Onion rings. Regular canned vegetables, except low-sodium or reduced-sodium items. Regular canned tomato sauce and paste. Regular tomato and vegetable juice. Frozen vegetables in sauces. Grains Instant hot cereals. Bread stuffing, pancake, and biscuit mixes. Croutons. Seasoned rice or pasta mixes. Noodle soup cups. Boxed or frozen macaroni and cheese. Regular salted crackers.  Self-rising flour. Meats and other proteins Meat or fish that is salted, canned, smoked, spiced, or pickled. Precooked or cured meat, such as sausages or meat loaves. Tomasa Blase. Ham. Pepperoni. Hot dogs. Corned beef. Chipped beef. Salt pork. Jerky. Pickled herring, anchovies, and sardines. Regular canned tuna. Salted nuts. Dairy Processed cheese and cheese spreads. Hard cheeses. Cheese curds. Blue cheese. Feta cheese. String cheese. Regular cottage cheese. Buttermilk. Canned milk. Fats and oils Salted butter. Regular margarine. Ghee. Bacon fat. Seasonings and condiments Onion salt, garlic salt, seasoned salt, table salt, and sea salt. Canned and packaged gravies. Worcestershire sauce. Tartar sauce. Barbecue sauce. Teriyaki sauce. Soy sauce, including reduced-sodium soy sauce. Steak sauce. Fish sauce. Oyster sauce. Cocktail sauce. Horseradish that you find on the shelf. Regular ketchup and mustard. Meat flavorings and tenderizers. Bouillon cubes. Hot sauce. Pre-made or packaged marinades. Pre-made or packaged taco seasonings. Relishes. Regular salad dressings. Salsa. Other foods Salted popcorn and pretzels. Corn chips and puffs. Potato and tortilla chips. Canned or dried soups. Pizza. Frozen entrees and pot pies. The items listed above may not be all the foods and drinks you should avoid. Talk to a dietitian to learn more. This information is not intended to replace advice given to you by your health care provider. Make sure you discuss any questions you have with your health care provider. Document Revised: 07/12/2022 Document Reviewed: 07/12/2022 Elsevier Patient Education  2024 [employer]  Heart-Healthy Eating Plan Many factors influence your heart health, including eating and  exercise habits. Heart health is also called coronary health. Coronary risk increases with abnormal blood fat (lipid) levels. A heart-healthy eating plan includes limiting unhealthy fats, increasing healthy fats, limiting  salt (sodium) intake, and making other diet and lifestyle changes. What is my plan? Your health care provider may recommend that: You limit your fat intake to _________% or less of your total calories each day. You limit your saturated fat intake to _________% or less of your total calories each day. You limit the amount of cholesterol in your diet to less than _________ mg per day. You limit the amount of sodium in your diet to less than _________ mg per day. What are tips for following this plan? Cooking Cook foods using methods other than frying. Baking, boiling, grilling, and broiling are all good options. Other ways to reduce fat include: Removing the skin from poultry. Removing all visible fats from meats. Steaming vegetables in water or broth. Meal planning  At meals, imagine dividing your plate into fourths: Fill one-half of your plate with vegetables and green salads. Fill one-fourth of your plate with whole grains. Fill one-fourth of your plate with lean protein foods. Eat 2-4 cups of vegetables per day. One cup of vegetables equals 1 cup (91 g) broccoli or cauliflower florets, 2 medium carrots, 1 large bell pepper, 1 large sweet potato, 1 large tomato, 1 medium white potato, 2 cups (150 g) raw leafy greens. Eat 1-2 cups of fruit per day. One cup of fruit equals 1 small apple, 1 large banana, 1 cup (237 g) mixed fruit, 1 large orange,  cup (82 g) dried fruit, 1 cup (240 mL) 100% fruit juice. Eat more foods that contain soluble fiber. Examples include apples, broccoli, carrots, beans, peas, and barley. Aim to get 25-30 g of fiber per day. Increase your consumption of legumes, nuts, and seeds to 4-5 servings per week. One serving of dried beans or legumes equals  cup (90 g) cooked, 1 serving of nuts is  oz (12 almonds, 24 pistachios, or 7 walnut halves), and 1 serving of seeds equals  oz (8 g). Fats Choose healthy fats more often. Choose monounsaturated and polyunsaturated  fats, such as olive and canola oils, avocado oil, flaxseeds, walnuts, almonds, and seeds. Eat more omega-3 fats. Choose salmon, mackerel, sardines, tuna, flaxseed oil, and ground flaxseeds. Aim to eat fish at least 2 times each week. Check food labels carefully to identify foods with trans fats or high amounts of saturated fat. Limit saturated fats. These are found in animal products, such as meats, butter, and cream. Plant sources of saturated fats include palm oil, palm kernel oil, and coconut oil. Avoid foods with partially hydrogenated oils in them. These contain trans fats. Examples are stick margarine, some tub margarines, cookies, crackers, and other baked goods. Avoid fried foods. General information Eat more home-cooked food and less restaurant, buffet, and fast food. Limit or avoid alcohol. Limit foods that are high in added sugar and simple starches such as foods made using white refined flour (white breads, pastries, sweets). Lose weight if you are overweight. Losing just 5-10% of your body weight can help your overall health and prevent diseases such as diabetes and heart disease. Monitor your sodium intake, especially if you have high blood pressure. Talk with your health care provider about your sodium intake. Try to incorporate more vegetarian meals weekly. What foods should I eat? Fruits All fresh, canned (in natural juice), or frozen fruits. Vegetables Fresh or frozen vegetables (raw, steamed,  roasted, or grilled). Green salads. Grains Most grains. Choose whole wheat and whole grains most of the time. Rice and pasta, including brown rice and pastas made with whole wheat. Meats and other proteins Lean, well-trimmed beef, veal, pork, and lamb. Chicken and [place] without skin. All fish and shellfish. Wild duck, rabbit, pheasant, and venison. Egg whites or low-cholesterol egg substitutes. Dried beans, peas, lentils, and tofu. Seeds and most nuts. Dairy Low-fat or nonfat cheeses,  including ricotta and mozzarella. Skim or 1% milk (liquid, powdered, or evaporated). Buttermilk made with low-fat milk. Nonfat or low-fat yogurt. Fats and oils Non-hydrogenated (trans-free) margarines. Vegetable oils, including soybean, sesame, sunflower, olive, avocado, peanut, safflower, corn, canola, and cottonseed. Salad dressings or mayonnaise made with a vegetable oil. Beverages Water (mineral or sparkling). Coffee and tea. Unsweetened ice tea. Diet beverages. Sweets and desserts Sherbet, gelatin, and fruit ice. Small amounts of dark chocolate. Limit all sweets and desserts. Seasonings and condiments All seasonings and condiments. The items listed above may not be a complete list of foods and beverages you can eat. Contact a dietitian for more options. What foods should I avoid? Fruits Canned fruit in heavy syrup. Fruit in cream or butter sauce. Fried fruit. Limit coconut. Vegetables Vegetables cooked in cheese, cream, or butter sauce. Fried vegetables. Grains Breads made with saturated or trans fats, oils, or whole milk. Croissants. Sweet rolls. Donuts. High-fat crackers, such as cheese crackers and chips. Meats and other proteins Fatty meats, such as hot dogs, ribs, sausage, bacon, rib-eye roast or steak. High-fat deli meats, such as salami and bologna. Caviar. Domestic duck and goose. Organ meats, such as liver. Dairy Cream, sour cream, cream cheese, and creamed cottage cheese. Whole-milk cheeses. Whole or 2% milk (liquid, evaporated, or condensed). Whole buttermilk. Cream sauce or high-fat cheese sauce. Whole-milk yogurt. Fats and oils Meat fat, or shortening. Cocoa butter, hydrogenated oils, palm oil, coconut oil, palm kernel oil. Solid fats and shortenings, including bacon fat, salt pork, lard, and butter. Nondairy cream substitutes. Salad dressings with cheese or sour cream. Beverages Regular sodas and any drinks with added sugar. Sweets and desserts Frosting. Pudding.  Cookies. Cakes. Pies. Milk chocolate or white chocolate. Buttered syrups. Full-fat ice cream or ice cream drinks. The items listed above may not be a complete list of foods and beverages to avoid. Contact a dietitian for more information. Summary Heart-healthy meal planning includes limiting unhealthy fats, increasing healthy fats, limiting salt (sodium) intake and making other diet and lifestyle changes. Lose weight if you are overweight. Losing just 5-10% of your body weight can help your overall health and prevent diseases such as diabetes and heart disease. Focus on eating a balance of foods, including fruits and vegetables, low-fat or nonfat dairy, lean protein, nuts and legumes, whole grains, and heart-healthy oils and fats. This information is not intended to replace advice given to you by your health care provider. Make sure you discuss any questions you have with your health care provider. Document Revised: 07/31/2021 Document Reviewed: 07/31/2021 Elsevier Patient Education  2024 [employer].

## 2022-11-30 NOTE — Progress Notes (Signed)
Office Visit    Patient Name: [name] Date of Encounter: 11/30/2022  PCP:  Sigmund Hazel, MD   [facility]  Cardiologist:  Donato Schultz, MD  Advanced Practice Provider:  No care team member to display Electrophysiologist:  None   HPI    [name] is a 52 y.o. female with a past medical history of palpitations and chest pain presents today for follow-up appointment.  Reviewed her coronary calcium score from 2019 which was 0.  No evidence of coronary artery disease on CT scan.  Palpitations had been previously treated with Bystolic, then atenolol, and then back to Bystolic 15 mg daily.  Overall at her last appointment in February she was doing quite well.  Her Adderall was being utilized for ADHD.  Went back to emergency room in 2022 when her Apple Watch said she was in atrial fibrillation.  The ER doctor thought it was normal sinus rhythm with PACs.  ZIO monitor showed no atrial fibrillation.  Brief atrial tachycardia, 10 beats noted.  She was given diltiazem 30 mg as needed.  She is working on [employer].  High blood pressure has been noted at home.  And it was elevated at her last appointment.  Started HCTZ.  Today, she tells me that she discontinued her HCTZ about a week after starting it because she felt like she had kidney pain and she was worried about her lab work.  Her blood pressure is better today 128/68 and she appears euvolemic on exam.  Sometimes she felt like she was having some presyncope symptoms.  Felt like she was going to pass out and occasionally having some "fuzziness".  She has been noticing it happening a lot more often over the past few weeks.  She typically notices it when she is walking or exerting herself.  She also explained it as a "woozy feeling".  Is been going on for years on and off but lately becoming more frequent and now occurs with her just sitting in a chair.  She has a history of asthma.  She states that when she  would run as a kid longer distances her heart rate would jump really high.  Reports no shortness of breath nor dyspnea on exertion. Reports no chest pain, pressure, or tightness. No edema, orthopnea, PND. Reports no palpitations.   Past Medical History    Past Medical History:  Diagnosis Date   ADD (attention deficit disorder)    ALLERGIC RHINITIS 10/10/2007   Qualifier: Diagnosis of  By: Craige Cotta MD, Vineet     Anxiety    Chest pain    Hypertension    Intrinsic asthma, unspecified 10/10/2007   Qualifier: Diagnosis of  By: Craige Cotta MD, Vineet     Migraines    Palpitations    Peptic ulcer    VT (ventricular tachycardia) (HCC)    Past Surgical History:  Procedure Laterality Date   CESAREAN SECTION     TUBAL LIGATION      Allergies  Allergies  Allergen Reactions   Escitalopram Oxalate Other (See Comments)    Difficulty breathing as air builds up in stomach   Estrogens Other (See Comments)    Birth control pills with estrogen cause lupus-like symptoms   Pseudoephedrine Other (See Comments)    Difficulty breathing as air builds up in stomach   Mirapex [Pramipexole Dihydrochloride]    Strattera [Atomoxetine Hcl]    Sudafed [Pseudoephedrine Hcl]    Sumatriptan Other (See Comments)  Bupropion Hcl Other (See Comments)    Gets angry, "ragey"     EKGs/Labs/Other Studies Reviewed:   The following studies were reviewed today: Cardiac Studies & Procedures     STRESS TESTS  EXERCISE TOLERANCE TEST (ETT) 10/09/2017  Narrative  Blood pressure demonstrated a hypertensive response to exercise.  There was less than 1mm of J point depression with downsloping ST segments in the inferolateral leads at peak exercise and into recovery.  Suggest stress myoview to assess for ischemia.   ECHOCARDIOGRAM  ECHOCARDIOGRAM COMPLETE 07/30/2016  Narrative *[facility]* [address] [place], [place]  [address] [phone]  ------------------------------------------------------------------- Transthoracic Echocardiography  Patient:    Versa, Marson MR #:       [id number] Study Date: 07/30/2016 Gender:     F Age:        60 Height:     166.4 cm Weight:     79.7 kg BSA:        1.94 m^2 Pt. Status: Room:  SONOGRAPHER  Vergia Alcon Althea Grimmer, M.D. REFERRING    Donato Schultz, M.D. ATTENDING    Mihai Croitoru, MD PERFORMING   [facility]  cc:  ------------------------------------------------------------------- LV EF: 55% -   60%  ------------------------------------------------------------------- Indications:      (R01.1).  ------------------------------------------------------------------- History:   PMH:  Palpitations. Acquired from the patient and from the patient&'s chart.  Murmur.  Risk factors:  Former tobacco use.  ------------------------------------------------------------------- Study Conclusions  - Left ventricle: The cavity size was normal. Systolic function was normal. The estimated ejection fraction was in the range of 55% to 60%. Wall motion was normal; there were no regional wall motion abnormalities. Left ventricular diastolic function parameters were normal.  ------------------------------------------------------------------- Study data:  Comparison was made to the study of 08/12/2008.  Study status:  Routine.  Procedure:  The patient reported no pain pre or post test. Transthoracic echocardiography for left ventricular function evaluation and for assessment of valvular function. Image quality was adequate.  Study completion:  There were no complications.          Transthoracic echocardiography.  M-mode, complete 2D, spectral Doppler, and color Doppler.  Birthdate: Patient birthdate: April 01, 1971.  Age:  Patient is 52 yr old.  Sex: Gender: female.    BMI: 28.8 kg/m^2.  Blood pressure:     162/90 Patient status:  Outpatient.  Study date:   Study date: 07/30/2016. Study time: 07:46 AM.  Location:  [facility]  -------------------------------------------------------------------  ------------------------------------------------------------------- Left ventricle:  The cavity size was normal. Systolic function was normal. The estimated ejection fraction was in the range of 55% to 60%. Wall motion was normal; there were no regional wall motion abnormalities. The transmitral flow pattern was normal. The deceleration time of the early transmitral flow velocity was normal. The pulmonary vein flow pattern was normal. The tissue Doppler parameters were normal. Left ventricular diastolic function parameters were normal.  ------------------------------------------------------------------- Aortic valve:   Trileaflet; normal thickness leaflets. Mobility was not restricted.  Doppler:  Transvalvular velocity was within the normal range. There was no stenosis. There was no regurgitation.  ------------------------------------------------------------------- Aorta:  Aortic root: The aortic root was normal in size.  ------------------------------------------------------------------- Mitral valve:   Structurally normal valve.   Mobility was not restricted.  Doppler:  Transvalvular velocity was within the normal range. There was no evidence for stenosis. There was no regurgitation.    Peak gradient (D): 3 mm Hg.  ------------------------------------------------------------------- Left atrium:  The atrium was  normal in size.  ------------------------------------------------------------------- Right ventricle:  The cavity size was normal. Wall thickness was normal. Systolic function was normal.  ------------------------------------------------------------------- Pulmonic valve:   Poorly visualized.  The valve appears to be grossly normal.    Doppler:  Transvalvular velocity was within the normal range. There was no evidence for  stenosis.  ------------------------------------------------------------------- Tricuspid valve:   Structurally normal valve.    Doppler: Transvalvular velocity was within the normal range. There was no regurgitation.  ------------------------------------------------------------------- Pulmonary artery:   The main pulmonary artery was normal-sized. Systolic pressure was within the normal range.  ------------------------------------------------------------------- Right atrium:  The atrium was normal in size.  ------------------------------------------------------------------- Pericardium:  There was no pericardial effusion.  ------------------------------------------------------------------- Systemic veins: Inferior vena cava: The vessel was normal in size. The respirophasic diameter changes were in the normal range (>= 50%), consistent with normal central venous pressure.  ------------------------------------------------------------------- Measurements  Left ventricle                           Value        Reference LV ID, ED, PLAX chordal          (L)     40.1  mm     43 - 52 LV ID, ES, PLAX chordal                  24.3  mm     23 - 38 LV fx shortening, PLAX chordal           39    %      >=29 LV PW thickness, ED                      9.54  mm     --------- IVS/LV PW ratio, ED                      1.16         <=1.3 Stroke volume, 2D                        52    ml     --------- Stroke volume/bsa, 2D                    27    ml/m^2 --------- LV ejection fraction, 1-p A4C            67    %      --------- LV end-diastolic volume, 2-p             61    ml     --------- LV end-systolic volume, 2-p              20    ml     --------- LV ejection fraction, 2-p                67    %      --------- Stroke volume, 2-p                       41    ml     --------- LV end-diastolic volume/bsa, 2-p         31    ml/m^2 --------- LV end-systolic volume/bsa, 2-p          10    ml/m^2  --------- Stroke volume/bsa, 2-p  21.1  ml/m^2 --------- LV e&', lateral                           11.3  cm/s   --------- LV E/e&', lateral                         8.12         --------- LV e&', medial                            7.99  cm/s   --------- LV E/e&', medial                          11.49        --------- LV e&', average                           9.65  cm/s   --------- LV E/e&', average                         9.52         ---------  Ventricular septum                       Value        Reference IVS thickness, ED                        11.1  mm     ---------  LVOT                                     Value        Reference LVOT ID, S                               18    mm     --------- LVOT area                                2.54  cm^2   --------- LVOT ID                                  18    mm     --------- LVOT peak velocity, S                    102   cm/s   --------- LVOT mean velocity, S                    63.2  cm/s   --------- LVOT VTI, S                              20.5  cm     --------- LVOT peak gradient, S                    4     mm Hg  --------- Stroke volume (SV), LVOT DP  52.2  ml     --------- Stroke index (SV/bsa), LVOT DP           26.9  ml/m^2 ---------  Aorta                                    Value        Reference Aortic root ID, ED                       26    mm     --------- Ascending aorta ID, A-P, S               26    mm     ---------  Left atrium                              Value        Reference LA ID, A-P, ES                           35    mm     --------- LA ID/bsa, A-P                           1.8   cm/m^2 <=2.2 LA volume, S                             41    ml     --------- LA volume/bsa, S                         21.1  ml/m^2 --------- LA volume, ES, 1-p A4C                   33    ml     --------- LA volume/bsa, ES, 1-p A4C               17    ml/m^2 --------- LA volume, ES, 1-p A2C                   45     ml     --------- LA volume/bsa, ES, 1-p A2C               23.2  ml/m^2 ---------  Mitral valve                             Value        Reference Mitral E-wave peak velocity              91.8  cm/s   --------- Mitral A-wave peak velocity              67.1  cm/s   --------- Mitral deceleration time         (H)     275   ms     150 - 230 Mitral peak gradient, D                  3     mm Hg  --------- Mitral E/A ratio, peak  1.4          ---------  Systemic veins                           Value        Reference Estimated CVP                            3     mm Hg  ---------  Right ventricle                          Value        Reference RV s&', lateral, S                        14.4  cm/s   ---------  Legend: (L)  and  (H)  mark values outside specified reference range.  ------------------------------------------------------------------- Prepared and Electronically Authenticated by  Thurmon Fair, MD 2018-01-22T14:16:35    MONITORS  LONG TERM MONITOR (3-14 DAYS) 04/10/2021  Narrative  Sinus rhythm with average heart rate of 85 bpm  Rare PVCs and PACs noted.  Brief paroxysmal atrial tachycardia longest lasting 10 beats with average heart rate of 132 bpm, fastest lasting 8 beats with 190 bpm  No atrial fibrillation, no pauses, no ventricular tachycardia.  Symptoms of fluttering or racing during night, 12:30 AM, 1 AM, were associated with sinus rhythm heart rate 96 bpm.  No adverse rhythms noted on monitor.  Overall reassuring. Donato Schultz, MD   CT SCANS  CT CORONARY MORPH W/CTA COR W/SCORE 01/17/2018  Addendum 01/17/2018  3:29 PM ADDENDUM REPORT: 01/17/2018 15:26  EXAM: OVER-READ INTERPRETATION  CT CHEST  The following report is an over-read performed by radiologist Dr. Deberah Pelton [facility] on 01/17/2018. This over-read does not include interpretation of cardiac or coronary anatomy or pathology. The coronary CT interpretation by  the cardiologist is attached.  COMPARISON:  None.  FINDINGS: The visualized portions of the lower lung fields show no suspicious nodules, masses, or infiltrates. The visualized portions of the mediastinum and chest wall are unremarkable.  IMPRESSION: No significant non-cardiac abnormality seen in visualized portion of the thorax.   Electronically Signed By: Myles Rosenthal M.D. On: 01/17/2018 15:26  Narrative CLINICAL DATA:  52 year old female with palpitations and nsVT.  EXAM: Cardiac/Coronary  CT  TECHNIQUE: The patient was scanned on a [employer].  FINDINGS: A 120 kV prospective scan was triggered in the descending thoracic aorta at 111 HU's. Axial non-contrast 3 mm slices were carried out through the heart. The data set was analyzed on a dedicated work station and scored using the Agatson method. Gantry rotation speed was 250 msecs and collimation was .6 mm. No beta blockade and 0.8 mg of sl NTG was given. The 3D data set was reconstructed in 5% intervals of the 67-82 % of the R-R cycle. Diastolic phases were analyzed on a dedicated work station using MPR, MIP and VRT modes. The patient received 80 cc of contrast.  Aorta:  Normal size.  No calcifications.  No dissection.  Aortic Valve:  Trileaflet.  No calcifications.  Coronary Arteries:  Normal coronary origin.  Right dominance.  RCA is a large dominant artery that gives rise to PDA and PLA. There is no plaque.  Left main is a large artery that gives rise to LAD and LCX arteries. Left  main has no CAD.  LAD is a large vessel that gives rise to one small diagonal artery and has no plaque.  LCX is a non-dominant artery that gives rise to one large OM1 branch. There is no plaque.  Other findings:  Normal pulmonary vein drainage into the left atrium.  Normal let atrial appendage without a thrombus.  Normal size of the pulmonary artery.  IMPRESSION: 1. Coronary calcium score of 0. This was 0  percentile for age and sex matched control.  2. Normal coronary origin with right dominance.  3. No evidence of CAD.  Electronically Signed: By: Tobias Alexander On: 01/17/2018 15:11           EKG:  EKG is not ordered today.    Recent Labs: 03/06/2022: Magnesium 2.3 09/21/2022: BUN 23; Creatinine, Ser 0.76; Potassium 4.4; Sodium 140  Recent Lipid Panel No results found for: "CHOL", "TRIG", "HDL", "CHOLHDL", "VLDL", "LDLCALC", "LDLDIRECT"  Home Medications   Current Meds  Medication Sig   albuterol (PROVENTIL HFA;VENTOLIN HFA) 108 (90 Base) MCG/ACT inhaler Inhale 1 puff into the lungs as needed for wheezing or shortness of breath.   amphetamine-dextroamphetamine (ADDERALL) 30 MG tablet Take 30 mg by mouth 2 (two) times daily.    B Complex CAPS See admin instructions.   cetirizine (ZYRTEC) 10 MG tablet Take 10 mg by mouth daily.   Cholecalciferol (VITAMIN D3 ULTRA STRENGTH) 125 MCG (5000 UT) capsule See admin instructions.   Collagen-Vitamin C-Biotin (COLLAGEN 1500/C PO) Take 1 capsule by mouth daily.   cyclobenzaprine (FLEXERIL) 10 MG tablet Take 10 mg by mouth 3 (three) times daily as needed for muscle spasms.   diltiazem (CARDIZEM) 30 MG tablet Take 1 tablet (30 mg total) by mouth every 6 (six) hours as needed.   fluticasone (FLONASE) 50 MCG/ACT nasal spray Place 2 sprays into both nostrils daily.   hydrochlorothiazide (HYDRODIURIL) 25 MG tablet Take 1 tablet (25 mg total) by mouth daily.   Multiple Vitamin (MULTIVITAMINS PO) Take 1 tablet by mouth daily.    naproxen sodium (ANAPROX) 220 MG tablet Take 220 mg by mouth as needed (for pain).    nebivolol (BYSTOLIC) 10 MG tablet TAKE 1 AND 1/2 TABLETS BY MOUTH  DAILY   rOPINIRole (REQUIP) 1 MG tablet Take 1.5 tablets by mouth at bedtime.   SUMAtriptan (IMITREX) 20 MG/ACT nasal spray 1 Spray(s) Both Nares 1-2 Times Daily     Review of Systems      All other systems reviewed and are otherwise negative except as noted  above.  Physical Exam    VS:  BP 128/68   Pulse 86   Ht 5\' 5"  (1.651 m)   Wt 212 lb (96.2 kg)   SpO2 96%   BMI 35.28 kg/m  , BMI Body mass index is 35.28 kg/m.  Wt Readings from Last 3 Encounters:  11/30/22 212 lb (96.2 kg)  09/04/22 206 lb (93.4 kg)  03/06/22 200 lb 9.6 oz (91 kg)     GEN: Well nourished, well developed, in no acute distress. HEENT: normal. Neck: Supple, no JVD, carotid bruits, or masses. Cardiac: RRR, no murmurs, rubs, or gallops. No clubbing, cyanosis, edema.  Radials/PT 2+ and equal bilaterally.  Respiratory:  Respirations regular and unlabored, clear to auscultation bilaterally. GI: Soft, nontender, nondistended. MS: No deformity or atrophy. Skin: Warm and dry, no rash. Neuro:  Strength and sensation are intact. Psych: Normal affect.  Assessment & Plan    Presyncope -We have ordered an echocardiogram today for further  evaluation -For now continue current medications which include diltiazem 30 mg as needed, Bystolic 15 mg daily  Hypertension -HCTZ was added at last visit but patient is discontinued due to side effects -Today blood pressure is well-controlled, euvolemic on exam -Continue current medications and continue to hold HCTZ for now  Palpitations -Well-controlled on her Bystolic -No recent issues, and no need for as needed Cardizem         Disposition: Follow up 6 months with Donato Schultz, MD or APP.  Signed, Sharlene Dory, PA-C 11/30/2022, 5:07 PM [facility]

## 2022-12-31 ENCOUNTER — Ambulatory Visit (HOSPITAL_COMMUNITY): Payer: 59 | Attending: Physician Assistant

## 2022-12-31 DIAGNOSIS — R55 Syncope and collapse: Secondary | ICD-10-CM | POA: Diagnosis present

## 2022-12-31 DIAGNOSIS — R011 Cardiac murmur, unspecified: Secondary | ICD-10-CM | POA: Insufficient documentation

## 2022-12-31 LAB — ECHOCARDIOGRAM COMPLETE
Area-P 1/2: 2.71 cm2
S' Lateral: 2 cm

## 2023-04-22 ENCOUNTER — Other Ambulatory Visit: Payer: Self-pay

## 2023-04-22 MED ORDER — SEMAGLUTIDE-WEIGHT MANAGEMENT 0.25 MG/0.5ML ~~LOC~~ SOAJ
0.2500 mg | SUBCUTANEOUS | 0 refills | Status: AC
  Filled 2023-04-22: qty 2, 28d supply, fill #0

## 2023-05-05 ENCOUNTER — Other Ambulatory Visit: Payer: Self-pay | Admitting: Physician Assistant

## 2023-05-20 ENCOUNTER — Other Ambulatory Visit: Payer: Self-pay

## 2023-05-20 MED ORDER — SEMAGLUTIDE-WEIGHT MANAGEMENT 0.5 MG/0.5ML ~~LOC~~ SOAJ
0.5000 mg | SUBCUTANEOUS | 0 refills | Status: AC
  Filled 2023-05-20: qty 2, 28d supply, fill #0

## 2023-05-20 MED ORDER — SEMAGLUTIDE-WEIGHT MANAGEMENT 1 MG/0.5ML ~~LOC~~ SOAJ
1.0000 mg | SUBCUTANEOUS | 0 refills | Status: AC
  Filled 2023-05-20 – 2023-06-11 (×2): qty 2, 28d supply, fill #0

## 2023-05-27 ENCOUNTER — Ambulatory Visit: Payer: 59 | Attending: Cardiology | Admitting: Cardiology

## 2023-05-27 ENCOUNTER — Encounter: Payer: Self-pay | Admitting: Cardiology

## 2023-05-27 VITALS — BP 134/82 | HR 91 | Ht 65.0 in | Wt 218.6 lb

## 2023-05-27 DIAGNOSIS — I10 Essential (primary) hypertension: Secondary | ICD-10-CM

## 2023-05-27 DIAGNOSIS — R002 Palpitations: Secondary | ICD-10-CM | POA: Diagnosis not present

## 2023-05-27 NOTE — Patient Instructions (Signed)

## 2023-05-27 NOTE — Progress Notes (Signed)
Cardiology Office Note:  .   Date:  05/27/2023  ID:  [name], DOB [birth year], MRN [id number] PCP: Sigmund Hazel, MD  [facility] Providers Cardiologist:  Donato Schultz, MD     History of Present Illness: .   [name] is a 52 y.o. female Discussed the use of AI scribe   History of Present Illness   The patient, a 52 year old with a history of hypertension, palpitations, and presyncope, presents for a follow-up visit. Her last consultation was approximately six months ago. An echocardiogram performed in the interim showed vigorous pump function with mild LVH. A long-term monitor revealed rare PVCs and PACs, brief atrial tachycardia, but no dangerous rhythms. A coronary CT scan from 2019 showed no evidence of coronary artery disease, with a calcium score of zero.  The patient reports no new health issues and describes her condition as stable. She recently started [facility] for weight management and is currently in her second month of treatment. She reports some gastrointestinal slowing and difficulty accessing the medication, but overall, she tolerates it well. She has not yet noticed significant weight loss but has observed a decrease in appetite and portion sizes.  The patient also mentions taking diltiazem as needed, Bystolic (1.5 tablets), and hydrochlorothiazide (25 mg). She rarely takes the hydrochlorothiazide but is considering it due to perceived fluid retention, as evidenced by redness and swelling in her fingers. She expresses hope that the [facility] will help improve her overall health status.           Studies Reviewed: .        Results LABS LDL cholesterol: 94 (04/22/2023) A1c: 5.8 (04/22/2023) Hemoglobin: 14.9 Creatinine: 0.7 (04/22/2023)  RADIOLOGY Coronary CT scan: No evidence of coronary artery disease, calcium score zero (2019)  DIAGNOSTIC Echocardiogram: Vigorous pump function with mild left ventricular hypertrophy (LVH) (12/31/2022) Long-term  monitor: Rare premature ventricular contractions (PVCs) and premature atrial contractions (PACs), brief atrial tachycardia, no evidence of dangerous rhythms (2022)  Risk Assessment/Calculations:            Physical Exam:   VS:  BP 134/82   Pulse 91   Ht 5\' 5"  (1.651 m)   Wt 218 lb 9.6 oz (99.2 kg)   SpO2 96%   BMI 36.38 kg/m    Wt Readings from Last 3 Encounters:  05/27/23 218 lb 9.6 oz (99.2 kg)  11/30/22 212 lb (96.2 kg)  09/04/22 206 lb (93.4 kg)    GEN: Well nourished, well developed in no acute distress NECK: No JVD; No carotid bruits CARDIAC: RRR, no murmurs, no rubs, no gallops RESPIRATORY:  Clear to auscultation without rales, wheezing or rhonchi  ABDOMEN: Soft, non-tender, non-distended EXTREMITIES:  No edema; No deformity   ASSESSMENT AND PLAN: .    Assessment and Plan    Palpitations and Presyncope Palpitations and presyncope are well-managed. Previous echocardiogram showed vigorous pump function with mild LVH. Long-term monitor in 2022 showed rare PVCs and PACs, brief atrial tachycardia, but no dangerous rhythms. Coronary CT scan in 2019 showed no evidence of coronary artery disease, calcium score was zero. - Follow-up in one year  Hypertension Hypertension is well-controlled. Blood pressure readings are within acceptable range. Current medications include diltiazem, Bystolic (nebivolol) 1.5 tablets 15mg , and hydrochlorothiazide 25 mg. - Continue current medications: diltiazem, Bystolic 1.5 tablets, hydrochlorothiazide 25 mg - Follow-up in one year  Obesity Currently on Wegovy for weight management, in the second month. Experiencing minor side effects but overall tolerating well. No significant weight loss  yet, but reduced appetite and food intake noted. Hopes Reginal Lutes will help in reducing A1c levels. - Continue [phone] - Encourage physical activity: 30 minutes a day - Follow-up in one year  Pre-diabetes Hemoglobin A1c is 5.8, which is an improvement. Hopes  Reginal Lutes will help in further reducing A1c levels. - Continue monitoring A1c levels - Follow-up in one year  General Health Maintenance LDL cholesterol is 94, hemoglobin is 14.9, creatinine is 0.7. All other lab results are within normal limits. - Continue current health maintenance regimen - Follow-up in one year.               Signed, Donato Schultz, MD

## 2023-05-29 ENCOUNTER — Other Ambulatory Visit: Payer: Self-pay

## 2023-06-11 ENCOUNTER — Other Ambulatory Visit: Payer: Self-pay

## 2023-10-22 ENCOUNTER — Other Ambulatory Visit: Payer: Self-pay | Admitting: Physician Assistant
